# Patient Record
Sex: Male | Born: 1980 | Race: White | Hispanic: No | Marital: Single | Smoking: Current every day smoker
Health system: Southern US, Community
[De-identification: ages and names within clinical notes are randomized; demographics above are authoritative.]

## PROBLEM LIST (undated history)

## (undated) DIAGNOSIS — Z5189 Encounter for other specified aftercare: Secondary | ICD-10-CM

## (undated) DIAGNOSIS — B1920 Unspecified viral hepatitis C without hepatic coma: Secondary | ICD-10-CM

## (undated) DIAGNOSIS — Z22322 Carrier or suspected carrier of Methicillin resistant Staphylococcus aureus: Secondary | ICD-10-CM

## (undated) DIAGNOSIS — K259 Gastric ulcer, unspecified as acute or chronic, without hemorrhage or perforation: Secondary | ICD-10-CM

## (undated) HISTORY — PX: BELOW KNEE LEG AMPUTATION: SUR23

## (undated) HISTORY — PX: CHOLECYSTECTOMY: SHX55

---

## 2010-01-17 DIAGNOSIS — K259 Gastric ulcer, unspecified as acute or chronic, without hemorrhage or perforation: Secondary | ICD-10-CM

## 2010-01-17 HISTORY — DX: Gastric ulcer, unspecified as acute or chronic, without hemorrhage or perforation: K25.9

## 2014-09-05 ENCOUNTER — Inpatient Hospital Stay (HOSPITAL_COMMUNITY)
Admission: EM | Admit: 2014-09-05 | Discharge: 2014-09-13 | DRG: 442 | Disposition: A | Discharge status: Home / Self Care | Payer: BLUE CROSS/BLUE SHIELD | Attending: Internal Medicine | Admitting: Internal Medicine

## 2014-09-05 ENCOUNTER — Encounter (HOSPITAL_COMMUNITY): Payer: Self-pay

## 2014-09-05 DIAGNOSIS — D125 Benign neoplasm of sigmoid colon: Secondary | ICD-10-CM | POA: Diagnosis present

## 2014-09-05 DIAGNOSIS — D62 Acute posthemorrhagic anemia: Secondary | ICD-10-CM | POA: Diagnosis present

## 2014-09-05 DIAGNOSIS — T40605A Adverse effect of unspecified narcotics, initial encounter: Secondary | ICD-10-CM | POA: Diagnosis present

## 2014-09-05 DIAGNOSIS — F1123 Opioid dependence with withdrawal: Secondary | ICD-10-CM | POA: Diagnosis present

## 2014-09-05 DIAGNOSIS — B1710 Acute hepatitis C without hepatic coma: Secondary | ICD-10-CM | POA: Diagnosis not present

## 2014-09-05 DIAGNOSIS — K921 Melena: Secondary | ICD-10-CM | POA: Diagnosis present

## 2014-09-05 DIAGNOSIS — F17210 Nicotine dependence, cigarettes, uncomplicated: Secondary | ICD-10-CM | POA: Diagnosis present

## 2014-09-05 DIAGNOSIS — B179 Acute viral hepatitis, unspecified: Secondary | ICD-10-CM | POA: Diagnosis present

## 2014-09-05 DIAGNOSIS — K6289 Other specified diseases of anus and rectum: Secondary | ICD-10-CM | POA: Diagnosis present

## 2014-09-05 DIAGNOSIS — R1110 Vomiting, unspecified: Secondary | ICD-10-CM | POA: Diagnosis present

## 2014-09-05 DIAGNOSIS — R1011 Right upper quadrant pain: Secondary | ICD-10-CM | POA: Diagnosis present

## 2014-09-05 DIAGNOSIS — K920 Hematemesis: Secondary | ICD-10-CM | POA: Diagnosis present

## 2014-09-05 DIAGNOSIS — K7200 Acute and subacute hepatic failure without coma: Secondary | ICD-10-CM | POA: Diagnosis not present

## 2014-09-05 DIAGNOSIS — K922 Gastrointestinal hemorrhage, unspecified: Secondary | ICD-10-CM | POA: Diagnosis present

## 2014-09-05 HISTORY — DX: Unspecified viral hepatitis C without hepatic coma: B19.20

## 2014-09-05 HISTORY — DX: Gastric ulcer, unspecified as acute or chronic, without hemorrhage or perforation: K25.9

## 2014-09-05 LAB — CBC WITH DIFFERENTIAL/PLATELET
BASOS PCT: 0 % (ref 0–1)
Basophils Absolute: 0 10*3/uL (ref 0.0–0.1)
EOS ABS: 0.1 10*3/uL (ref 0.0–0.7)
Eosinophils Relative: 1 % (ref 0–5)
HCT: 41.5 % (ref 39.0–52.0)
HEMOGLOBIN: 14.3 g/dL (ref 13.0–17.0)
LYMPHS ABS: 1.2 10*3/uL (ref 0.7–4.0)
Lymphocytes Relative: 21 % (ref 12–46)
MCH: 30 pg (ref 26.0–34.0)
MCHC: 34.5 g/dL (ref 30.0–36.0)
MCV: 87 fL (ref 78.0–100.0)
Monocytes Absolute: 0.3 10*3/uL (ref 0.1–1.0)
Monocytes Relative: 5 % (ref 3–12)
NEUTROS PCT: 73 % (ref 43–77)
Neutro Abs: 4.2 10*3/uL (ref 1.7–7.7)
Platelets: 181 10*3/uL (ref 150–400)
RBC: 4.77 MIL/uL (ref 4.22–5.81)
RDW: 15 % (ref 11.5–15.5)
WBC: 5.8 10*3/uL (ref 4.0–10.5)

## 2014-09-05 LAB — COMPREHENSIVE METABOLIC PANEL
ALBUMIN: 4.2 g/dL (ref 3.5–5.0)
ALK PHOS: 100 U/L (ref 38–126)
ALT: 271 U/L — AB (ref 17–63)
AST: 122 U/L — AB (ref 15–41)
Anion gap: 9 (ref 5–15)
BUN: 8 mg/dL (ref 6–20)
CALCIUM: 9 mg/dL (ref 8.9–10.3)
CO2: 29 mmol/L (ref 22–32)
CREATININE: 0.86 mg/dL (ref 0.61–1.24)
Chloride: 106 mmol/L (ref 101–111)
GFR calc Af Amer: >60 mL/min (ref >60)
GFR calc non Af Amer: >60 mL/min (ref >60)
GLUCOSE: 126 mg/dL — AB (ref 65–99)
Potassium: 3.5 mmol/L (ref 3.5–5.1)
SODIUM: 144 mmol/L (ref 135–145)
Total Bilirubin: 0.7 mg/dL (ref 0.3–1.2)
Total Protein: 6.8 g/dL (ref 6.5–8.1)

## 2014-09-05 LAB — LIPASE, BLOOD: Lipase: 25 U/L (ref 22–51)

## 2014-09-05 LAB — URINALYSIS, ROUTINE W REFLEX MICROSCOPIC
BILIRUBIN URINE: NEGATIVE
Glucose, UA: NEGATIVE mg/dL
HGB URINE DIPSTICK: NEGATIVE
Ketones, ur: NEGATIVE mg/dL
Leukocytes, UA: NEGATIVE
Nitrite: NEGATIVE
PH: 7 (ref 5.0–8.0)
Protein, ur: NEGATIVE mg/dL
SPECIFIC GRAVITY, URINE: 1.019 (ref 1.005–1.030)
Urobilinogen, UA: 0.2 mg/dL (ref 0.0–1.0)

## 2014-09-05 LAB — POC OCCULT BLOOD, ED: Fecal Occult Bld: POSITIVE — AB

## 2014-09-05 MED ORDER — ONDANSETRON HCL 4 MG/2ML IJ SOLN
4.0000 mg | Freq: Once | INTRAMUSCULAR | Status: AC
  Administered 2014-09-05: 4 mg via INTRAVENOUS
  Filled 2014-09-05: qty 2

## 2014-09-05 MED ORDER — IOHEXOL 300 MG/ML  SOLN: 25.0000 mL | Freq: Once | INTRAMUSCULAR | Status: DC | PRN

## 2014-09-05 MED ORDER — METOCLOPRAMIDE HCL 5 MG/ML IJ SOLN
10.0000 mg | Freq: Once | INTRAMUSCULAR | Status: AC
  Administered 2014-09-05: 10 mg via INTRAVENOUS
  Filled 2014-09-05: qty 2

## 2014-09-05 MED ORDER — IBUPROFEN 800 MG PO TABS: 800.0000 mg | ORAL_TABLET | Freq: Once | ORAL | Status: DC

## 2014-09-05 MED ORDER — HYDROMORPHONE HCL 1 MG/ML IJ SOLN
1.0000 mg | INTRAMUSCULAR | Status: DC | PRN
  Administered 2014-09-05 – 2014-09-06 (×2): 1 mg via INTRAVENOUS
  Filled 2014-09-05 (×2): qty 1

## 2014-09-05 MED ORDER — SODIUM CHLORIDE 0.9 % IV SOLN
1000.0000 mL | Freq: Once | INTRAVENOUS | Status: AC
  Administered 2014-09-05: 1000 mL via INTRAVENOUS

## 2014-09-05 NOTE — ED Notes (Signed)
Per EMS pt started having n/v at 1300 with noted blood in stool x 2 days; Pt c/o of right upper/ middle abdominal pain  At 10/10 on arrival; Pt reports he had a bright red bloody stool this after noon; Pt has hx of Hep C; pt received 50mg  in total of phenergan prior to arrival. Pt is from [facility]; Pt has hx of ulcer in stomach in 2012 which he had surgery for; pt a&o x 4 on arrival.

## 2014-09-05 NOTE — ED Provider Notes (Signed)
CSN: [id number]     Arrival date & time 09/05/14  2214 History   First MD Initiated Contact with Patient 09/05/14 2258     Chief Complaint  Patient presents with  . Nausea  . Emesis  . Abdominal Pain  . Blood In Stools     (Consider location/radiation/quality/duration/timing/severity/associated sxs/prior Treatment) HPI  Dymond Spreen is a 34 y.o. male with past medical history of hepatitis C, gastric ulcer status post repair presenting today with abdominal pain. He states he's had blood in his stool for the past 2 days. He has sharp midepigastric abdominal pain without radiation. Today he had multiple episodes of nausea and vomiting. He states he has bright and dark stool over the last 2 days. Patient is a transfer from [facility]. He was given Phenergan prior to arrival. He continues to complain of abdominal pain. He states he's also had fevers and chills. Patient has no further complaints.  10 Systems reviewed and are negative for acute change except as noted in the HPI.    Past Medical History  Diagnosis Date  . Hepatitis C   . Stomach ulcer 2012   History reviewed. No pertinent past surgical history. No family history on file. Social History  Substance Use Topics  . Smoking status: Current Every Day Smoker -- 0.50 packs/day    Types: Cigarettes  . Smokeless tobacco: None  . Alcohol Use: No    Review of Systems    Allergies  Review of patient's allergies indicates not on file.  Home Medications   Prior to Admission medications   Not on File   BP 114/57 mmHg  Pulse 72  Temp(Src) 100.6 F (38.1 C) (Rectal)  Resp 10  Ht 6' (1.829 m)  Wt 180 lb (81.647 kg)  BMI 24.41 kg/m2  SpO2 97% Physical Exam  Constitutional: He is oriented to person, place, and time. Vital signs are normal. He appears well-developed and well-nourished.  Non-toxic appearance. He does not appear ill. No distress.  HENT:  Head: Normocephalic and atraumatic.  Nose: Nose normal.   Mouth/Throat: Oropharynx is clear and moist. No oropharyngeal exudate.  Eyes: Conjunctivae and EOM are normal. Pupils are equal, round, and reactive to light. No scleral icterus.  Neck: Normal range of motion. Neck supple. No tracheal deviation, no edema, no erythema and normal range of motion present. No thyroid mass and no thyromegaly present.  Cardiovascular: Normal rate, regular rhythm, S1 normal, S2 normal, normal heart sounds, intact distal pulses and normal pulses.  Exam reveals no gallop and no friction rub.   No murmur heard. Pulses:      Radial pulses are 2+ on the right side, and 2+ on the left side.       Dorsalis pedis pulses are 2+ on the right side, and 2+ on the left side.  Pulmonary/Chest: Effort normal and breath sounds normal. No respiratory distress. He has no wheezes. He has no rhonchi. He has no rales.  Abdominal: Soft. Normal appearance and bowel sounds are normal. He exhibits no distension, no ascites and no mass. There is no hepatosplenomegaly. There is tenderness. There is no rebound, no guarding and no CVA tenderness.  Genitourinary: Guaiac positive stool.  Musculoskeletal: Normal range of motion. He exhibits no edema or tenderness.  Status post right AKA  Lymphadenopathy:    He has no cervical adenopathy.  Neurological: He is alert and oriented to person, place, and time. He has normal strength. No cranial nerve deficit or sensory deficit.  Skin: Skin  is warm, dry and intact. No petechiae and no rash noted. He is not diaphoretic. No erythema. No pallor.  Tactile fever.  Psychiatric: He has a normal mood and affect. His behavior is normal. Judgment normal.  Nursing note and vitals reviewed.   ED Course  Procedures (including critical care time) Labs Review Labs Reviewed  COMPREHENSIVE METABOLIC PANEL - Abnormal; Notable for the following:    Glucose, Bld 126 (*)    AST 122 (*)    ALT 271 (*)    All other components within normal limits  URINALYSIS, ROUTINE  W REFLEX MICROSCOPIC (NOT AT [facility]) - Abnormal; Notable for the following:    Color, Urine AMBER (*)    All other components within normal limits  POC OCCULT BLOOD, ED - Abnormal; Notable for the following:    Fecal Occult Bld POSITIVE (*)    All other components within normal limits  CBC WITH DIFFERENTIAL/PLATELET  LIPASE, BLOOD  OCCULT BLOOD X 1 CARD TO LAB, STOOL    Imaging Review Ct Abdomen Pelvis W Contrast  09/06/2014   CLINICAL DATA:  Mid epigastric abdominal pain with fever, nausea, and vomiting.  EXAM: CT ABDOMEN AND PELVIS WITH CONTRAST  TECHNIQUE: Multidetector CT imaging of the abdomen and pelvis was performed using the standard protocol following bolus administration of intravenous contrast.  CONTRAST:  120mL OMNIPAQUE IOHEXOL 300 MG/ML  SOLN  COMPARISON:  None.  FINDINGS: BODY WALL: Prior upper midline laparotomy.  LOWER CHEST: Subpleural scarring in the right lower lobe, mild.  ABDOMEN/PELVIS:  Liver: No focal abnormality.  Biliary: Cholecystectomy.  Pancreas: Unremarkable.  Spleen: Unremarkable.  Adrenals: Unremarkable.  Kidneys and ureters: Punctate nonobstructive stones in the bilateral kidneys. No ureteral calculus or hydronephrosis.  Bladder: Unremarkable.  Reproductive: No pathologic findings.  Bowel: No definite bowel wall thickening when accounting for under distention. No bowel obstruction. No appendicitis.  Retroperitoneum: Enlarged lymph nodes in the deep liver drainage, usually incidental in isolation.  Peritoneum: No ascites or pneumoperitoneum.  Vascular: Diminutive right lower extremity arterial and venous system in the setting of marked atrophy.  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  L1-L2 interbody ankylosis.  IMPRESSION: 1. No acute finding, including appendicitis. 2. 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 3. Cholecystectomy. 4. Tiny bilateral renal calculi.   Electronically Signed   By: Monte Fantasia M.D.   On: 09/06/2014 01:06   I have personally reviewed and evaluated these images and lab results as part of my medical decision-making.   EKG Interpretation None      MDM   Final diagnoses:  None   Patient presents to the ED for abdominal pain, blood in stool, and fever.  He is tender on exam as well.  Will obtain CT scan for evaluation.  He is also complaining of significant pain and takes a large dose of oxycodone at home, 30mg .  Will given 1mg  dilaudid and reassess.  Hgb normal at 14.3.  LFTs are elevated  CT scan is unremarkable. Patient continues to complain of vomiting and pain. He has been ordered Reglan as well as Zofran. If his symptoms can be controlled, patient safe for discharge with outpatient follow-up.  Patient's laboratory studies from August 8 show LFTs are normal. It is likely medications he has been given at his rehabilitation facility have caused an acute hepatitis. I do not know what the cause  of his fever is. Patient will be admitted to the hospitalist for further care.    Everlene Balls, MD 09/06/14 [phone]

## 2014-09-05 NOTE — ED Notes (Signed)
Pt has right AKA; Prosthetic at bedside

## 2014-09-06 ENCOUNTER — Emergency Department (HOSPITAL_COMMUNITY): Payer: BLUE CROSS/BLUE SHIELD

## 2014-09-06 ENCOUNTER — Encounter (HOSPITAL_COMMUNITY): Payer: Self-pay | Admitting: Radiology

## 2014-09-06 DIAGNOSIS — F17210 Nicotine dependence, cigarettes, uncomplicated: Secondary | ICD-10-CM | POA: Diagnosis present

## 2014-09-06 DIAGNOSIS — B1710 Acute hepatitis C without hepatic coma: Secondary | ICD-10-CM | POA: Diagnosis present

## 2014-09-06 DIAGNOSIS — D125 Benign neoplasm of sigmoid colon: Secondary | ICD-10-CM | POA: Diagnosis present

## 2014-09-06 DIAGNOSIS — S78111A Complete traumatic amputation at level between right hip and knee, initial encounter: Secondary | ICD-10-CM | POA: Insufficient documentation

## 2014-09-06 DIAGNOSIS — B1920 Unspecified viral hepatitis C without hepatic coma: Secondary | ICD-10-CM | POA: Insufficient documentation

## 2014-09-06 DIAGNOSIS — F1123 Opioid dependence with withdrawal: Secondary | ICD-10-CM | POA: Diagnosis present

## 2014-09-06 DIAGNOSIS — K7200 Acute and subacute hepatic failure without coma: Secondary | ICD-10-CM | POA: Diagnosis present

## 2014-09-06 DIAGNOSIS — B179 Acute viral hepatitis, unspecified: Secondary | ICD-10-CM | POA: Diagnosis present

## 2014-09-06 DIAGNOSIS — R1011 Right upper quadrant pain: Secondary | ICD-10-CM | POA: Diagnosis not present

## 2014-09-06 DIAGNOSIS — K922 Gastrointestinal hemorrhage, unspecified: Secondary | ICD-10-CM | POA: Diagnosis present

## 2014-09-06 DIAGNOSIS — R1110 Vomiting, unspecified: Secondary | ICD-10-CM | POA: Diagnosis present

## 2014-09-06 DIAGNOSIS — E876 Hypokalemia: Secondary | ICD-10-CM | POA: Diagnosis not present

## 2014-09-06 DIAGNOSIS — K625 Hemorrhage of anus and rectum: Secondary | ICD-10-CM | POA: Diagnosis not present

## 2014-09-06 DIAGNOSIS — K920 Hematemesis: Secondary | ICD-10-CM | POA: Diagnosis present

## 2014-09-06 DIAGNOSIS — D62 Acute posthemorrhagic anemia: Secondary | ICD-10-CM | POA: Diagnosis present

## 2014-09-06 DIAGNOSIS — K219 Gastro-esophageal reflux disease without esophagitis: Secondary | ICD-10-CM | POA: Insufficient documentation

## 2014-09-06 DIAGNOSIS — K6289 Other specified diseases of anus and rectum: Secondary | ICD-10-CM | POA: Diagnosis present

## 2014-09-06 DIAGNOSIS — T40605A Adverse effect of unspecified narcotics, initial encounter: Secondary | ICD-10-CM | POA: Diagnosis present

## 2014-09-06 DIAGNOSIS — K921 Melena: Secondary | ICD-10-CM | POA: Diagnosis present

## 2014-09-06 LAB — COMPREHENSIVE METABOLIC PANEL
ALBUMIN: 3.8 g/dL (ref 3.5–5.0)
ALT: 230 U/L — ABNORMAL HIGH (ref 17–63)
ANION GAP: 10 (ref 5–15)
AST: 96 U/L — AB (ref 15–41)
Alkaline Phosphatase: 85 U/L (ref 38–126)
BILIRUBIN TOTAL: 0.8 mg/dL (ref 0.3–1.2)
BUN: 10 mg/dL (ref 6–20)
CHLORIDE: 109 mmol/L (ref 101–111)
CO2: 24 mmol/L (ref 22–32)
Calcium: 8.3 mg/dL — ABNORMAL LOW (ref 8.9–10.3)
Creatinine, Ser: 0.66 mg/dL (ref 0.61–1.24)
GFR calc Af Amer: >60 mL/min (ref >60)
GFR calc non Af Amer: >60 mL/min (ref >60)
GLUCOSE: 113 mg/dL — AB (ref 65–99)
POTASSIUM: 4.2 mmol/L (ref 3.5–5.1)
SODIUM: 143 mmol/L (ref 135–145)
Total Protein: 6.4 g/dL — ABNORMAL LOW (ref 6.5–8.1)

## 2014-09-06 LAB — CBC
HEMATOCRIT: 40.3 % (ref 39.0–52.0)
HEMOGLOBIN: 13.2 g/dL (ref 13.0–17.0)
MCH: 29.3 pg (ref 26.0–34.0)
MCHC: 32.8 g/dL (ref 30.0–36.0)
MCV: 89.4 fL (ref 78.0–100.0)
Platelets: 154 10*3/uL (ref 150–400)
RBC: 4.51 MIL/uL (ref 4.22–5.81)
RDW: 15.4 % (ref 11.5–15.5)
WBC: 5.9 10*3/uL (ref 4.0–10.5)

## 2014-09-06 MED ORDER — FENTANYL 12 MCG/HR TD PT72
12.5000 ug | MEDICATED_PATCH | TRANSDERMAL | Status: DC
  Administered 2014-09-06: 12.5 ug via TRANSDERMAL
  Filled 2014-09-06: qty 1

## 2014-09-06 MED ORDER — MORPHINE SULFATE (PF) 2 MG/ML IV SOLN
1.0000 mg | INTRAVENOUS | Status: DC | PRN
  Administered 2014-09-06 (×2): 1 mg via INTRAVENOUS
  Filled 2014-09-06 (×2): qty 1

## 2014-09-06 MED ORDER — GABAPENTIN 600 MG PO TABS: 600.0000 mg | ORAL_TABLET | Freq: Three times a day (TID) | ORAL | Status: DC

## 2014-09-06 MED ORDER — METOCLOPRAMIDE HCL 5 MG/ML IJ SOLN
5.0000 mg | Freq: Once | INTRAMUSCULAR | Status: AC
  Administered 2014-09-06: 5 mg via INTRAVENOUS
  Filled 2014-09-06: qty 2

## 2014-09-06 MED ORDER — ONDANSETRON HCL 4 MG/2ML IJ SOLN
4.0000 mg | Freq: Once | INTRAMUSCULAR | Status: AC
  Administered 2014-09-06: 4 mg via INTRAVENOUS
  Filled 2014-09-06: qty 2

## 2014-09-06 MED ORDER — SENNOSIDES-DOCUSATE SODIUM 8.6-50 MG PO TABS: 1.0000 | ORAL_TABLET | Freq: Every evening | ORAL | Status: DC | PRN

## 2014-09-06 MED ORDER — ONDANSETRON HCL 4 MG PO TABS
4.0000 mg | ORAL_TABLET | Freq: Four times a day (QID) | ORAL | Status: DC | PRN
  Filled 2014-09-06: qty 1

## 2014-09-06 MED ORDER — PANTOPRAZOLE SODIUM 40 MG IV SOLR
40.0000 mg | Freq: Two times a day (BID) | INTRAVENOUS | Status: DC
  Administered 2014-09-06 – 2014-09-13 (×16): 40 mg via INTRAVENOUS
  Filled 2014-09-06 (×16): qty 40

## 2014-09-06 MED ORDER — CLONIDINE HCL 0.2 MG PO TABS
0.2000 mg | ORAL_TABLET | Freq: Three times a day (TID) | ORAL | Status: DC | PRN
  Filled 2014-09-06: qty 1

## 2014-09-06 MED ORDER — HYDROMORPHONE HCL 1 MG/ML IJ SOLN
1.0000 mg | Freq: Once | INTRAMUSCULAR | Status: AC
  Administered 2014-09-06: 1 mg via INTRAVENOUS
  Filled 2014-09-06: qty 1

## 2014-09-06 MED ORDER — SODIUM CHLORIDE 0.9 % IV SOLN
INTRAVENOUS | Status: DC
  Administered 2014-09-06 – 2014-09-07 (×3): via INTRAVENOUS
  Administered 2014-09-08: 100 mL/h via INTRAVENOUS
  Administered 2014-09-08 – 2014-09-11 (×6): via INTRAVENOUS

## 2014-09-06 MED ORDER — DICYCLOMINE HCL 10 MG PO CAPS
10.0000 mg | ORAL_CAPSULE | Freq: Four times a day (QID) | ORAL | Status: DC | PRN
  Administered 2014-09-07 – 2014-09-09 (×4): 10 mg via ORAL
  Filled 2014-09-06 (×5): qty 1

## 2014-09-06 MED ORDER — ONDANSETRON HCL 4 MG/2ML IJ SOLN
4.0000 mg | Freq: Four times a day (QID) | INTRAMUSCULAR | Status: DC | PRN
  Administered 2014-09-06 – 2014-09-12 (×21): 4 mg via INTRAVENOUS
  Filled 2014-09-06 (×22): qty 2

## 2014-09-06 MED ORDER — IOHEXOL 300 MG/ML  SOLN
100.0000 mL | Freq: Once | INTRAMUSCULAR | Status: AC | PRN
  Administered 2014-09-06: 100 mL via INTRAVENOUS

## 2014-09-06 MED ORDER — METHOCARBAMOL 500 MG PO TABS
1000.0000 mg | ORAL_TABLET | Freq: Three times a day (TID) | ORAL | Status: DC | PRN
  Administered 2014-09-06 – 2014-09-07 (×3): 1000 mg via ORAL
  Filled 2014-09-06 (×3): qty 2

## 2014-09-06 MED ORDER — ALUM & MAG HYDROXIDE-SIMETH 200-200-20 MG/5ML PO SUSP: 30.0000 mL | Freq: Four times a day (QID) | ORAL | Status: DC | PRN

## 2014-09-06 MED ORDER — PROMETHAZINE HCL 25 MG/ML IJ SOLN
25.0000 mg | Freq: Once | INTRAMUSCULAR | Status: AC
  Administered 2014-09-06: 25 mg via INTRAVENOUS
  Filled 2014-09-06: qty 1

## 2014-09-06 NOTE — ED Notes (Signed)
Patient transported to CT 

## 2014-09-06 NOTE — H&P (Signed)
History and Physical  Cameron Morton YQI:[id number] DOB: Sep 13, 1980 DOA: 09/05/2014  Referring physician: Dr Claudine Mouton, ED physician PCP: No primary care provider on file.   Chief Complaint: Abdominal pain, vomiting  HPI: Cameron Morton is a 34 y.o. male  With a history of hep C, stomach ulcers, opiate addiction. He is in recovery at [facility] was admitted on 08/25/2014. He has been there for opiate recovery. He was given Subutex 6 mg 2 days, then 4 mg 2 days, then 2 mg 2 days, then stopped. His last dose of Subutex was on 8/14. He reports no complications or some withdrawal symptoms within 2-3 days of taking the medication. He presented to the emergency department due to extreme generalized cramping abdominal pain with profuse vomiting that started on the morning of the 19th. When his symptoms were not resolving, the patient was brought to the emergency department for evaluation. The emergency department he received a milligram of Dilaudid, which improved his abdominal pain. The patient received several doses of Reglan, Phenergan, Zofran, which did not resolve his vomiting. His report vomiting did lighten somewhat with the Dilaudid although is still had a few episodes of vomiting. Vomiting was just stomach contents. Most of his abdominal pain is now centered in the right upper quadrant.  Additionally, he complains of 2 small bowel movements that appeared like several small blood clots, which were dark red in nature. He's never had this before. He denies rectal pain. He does have occasional hemorrhoids.  Patient denies any recent drug use   Review of Systems:   Pt complains of sweating, occasional palpitations.  Pt denies any fevers, chills, shortness of breath, chest pain, dyspnea on exertion, headache, blurred vision, vision changes, rash, skin ulcerations, skin wounds, diarrhea, constipation.  Review of systems are otherwise negative  Past Medical History  Diagnosis Date  . Hepatitis C   .  Stomach ulcer 2012   History reviewed. No pertinent past surgical history. Social History:  reports that he has been smoking Cigarettes.  He has been smoking about 0.50 packs per day. He does not have any smokeless tobacco history on file. He reports that he does not drink alcohol or use illicit drugs. Patient lives at [facility] currently & is able to participate in activities of daily living  No Known Allergies   family history of diabetes in grandparents on both sides.  Prior to Admission medications   Medication Sig Start Date End Date Taking? Authorizing Provider  acetaminophen (TYLENOL) 500 MG tablet Take 1,000 mg by mouth every 6 (six) hours as needed for mild pain.   Yes Historical Provider, MD  alum & mag hydroxide-simeth (MAALOX/MYLANTA) 200-200-20 MG/5ML suspension Take 30 mLs by mouth every 6 (six) hours as needed for indigestion or heartburn.   Yes Historical Provider, MD  gabapentin (NEURONTIN) 600 MG tablet Take 600 mg by mouth 3 (three) times daily.   Yes Historical Provider, MD  methocarbamol (ROBAXIN) 500 MG tablet Take 1,000 mg by mouth 3 (three) times daily as needed for muscle spasms.   Yes Historical Provider, MD  Multiple Vitamin (MULTIVITAMIN WITH MINERALS) TABS tablet Take 1 tablet by mouth daily.   Yes Historical Provider, MD  promethazine (PHENERGAN) 25 MG/ML injection Inject 25 mg into the vein every 4 (four) hours as needed for nausea or vomiting.   Yes Historical Provider, MD  QUEtiapine (SEROQUEL) 50 MG tablet Take 50 mg by mouth at bedtime as needed (sleep).   Yes Historical Provider, MD    Physical  Exam: BP 124/70 mmHg  Pulse 68  Temp(Src) 100.6 F (38.1 C) (Rectal)  Resp 19  Ht 6' (1.829 m)  Wt 81.647 kg (180 lb)  BMI 24.41 kg/m2  SpO2 96%  General: Young Caucasian male. Awake and alert and oriented x3. No acute cardiopulmonary distress. He doesn't appear uncomfortable  Eyes: Pupils equal, round, reactive to light. Extraocular muscles are intact.  Sclerae anicteric and noninjected.  ENT:  Moist mucosal membranes. No mucosal lesions.   Neck: Neck supple without lymphadenopathy. No carotid bruits. No masses palpated.  Cardiovascular: Regular rate with normal S1-S2 sounds. No murmurs, rubs, gallops auscultated. No JVD.  Respiratory: Good respiratory effort with no wheezes, rales, rhonchi. Lungs clear to auscultation bilaterally.  Abdomen: Soft, tender in the right upper quadrant, nondistended. Active bowel sounds. No masses or hepatosplenomegaly  Skin: Dry, warm to touch. 2+ dorsalis pedis and radial pulses. His stump appears clean, dry, intact and without any rashes or ulcerations Musculoskeletal: No calf or leg pain. All major joints not erythematous nontender.  Psychiatric: Intact judgment and insight.  Neurologic: No focal neurological deficits. Cranial nerves II through XII are grossly intact.           Labs on Admission:  Basic Metabolic Panel:  Recent Labs Lab 09/05/14 2303  NA 144  K 3.5  CL 106  CO2 29  GLUCOSE 126*  BUN 8  CREATININE 0.86  CALCIUM 9.0   Liver Function Tests:  Recent Labs Lab 09/05/14 2303  AST 122*  ALT 271*  ALKPHOS 100  BILITOT 0.7  PROT 6.8  ALBUMIN 4.2    Recent Labs Lab 09/05/14 2303  LIPASE 25   No results for input(s): AMMONIA in the last 168 hours. CBC:  Recent Labs Lab 09/05/14 2303  WBC 5.8  NEUTROABS 4.2  HGB 14.3  HCT 41.5  MCV 87.0  PLT 181   Cardiac Enzymes: No results for input(s): CKTOTAL, CKMB, CKMBINDEX, TROPONINI in the last 168 hours.  BNP (last 3 results) No results for input(s): BNP in the last 8760 hours.  ProBNP (last 3 results) No results for input(s): PROBNP in the last 8760 hours.  CBG: No results for input(s): GLUCAP in the last 168 hours.  Radiological Exams on Admission: Ct Abdomen Pelvis W Contrast  09/06/2014   CLINICAL DATA:  Mid epigastric abdominal pain with fever, nausea, and vomiting.  EXAM: CT ABDOMEN AND PELVIS WITH CONTRAST   TECHNIQUE: Multidetector CT imaging of the abdomen and pelvis was performed using the standard protocol following bolus administration of intravenous contrast.  CONTRAST:  114mL OMNIPAQUE IOHEXOL 300 MG/ML  SOLN  COMPARISON:  None.  FINDINGS: BODY WALL: Prior upper midline laparotomy.  LOWER CHEST: Subpleural scarring in the right lower lobe, mild.  ABDOMEN/PELVIS:  Liver: No focal abnormality.  Biliary: Cholecystectomy.  Pancreas: Unremarkable.  Spleen: Unremarkable.  Adrenals: Unremarkable.  Kidneys and ureters: Punctate nonobstructive stones in the bilateral kidneys. No ureteral calculus or hydronephrosis.  Bladder: Unremarkable.  Reproductive: No pathologic findings.  Bowel: No definite bowel wall thickening when accounting for under distention. No bowel obstruction. No appendicitis.  Retroperitoneum: Enlarged lymph nodes in the deep liver drainage, usually incidental in isolation.  Peritoneum: No ascites or pneumoperitoneum.  Vascular: Diminutive right lower extremity arterial and venous system in the setting of marked atrophy.  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  L1-L2 interbody ankylosis.  IMPRESSION: 1.  No acute finding, including appendicitis. 2. 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 3. Cholecystectomy. 4. Tiny bilateral renal calculi.   Electronically Signed   By: Monte Fantasia M.D.   On: 09/06/2014 01:06     Assessment/Plan Present on Admission:  . Acute hepatitis . Abdominal pain, right upper quadrant . Vomiting . GI bleed  This patient was discussed with the ED physician, including pertinent vitals, physical exam findings, labs, and imaging.  We also discussed care given by the ED provider.  #1 abdominal pain, right upper quadrant #2 vomiting  Admit for observation  Uncertain as to the etiology of the patient's  vomiting. He does have a mild fever, however his white count is normal. There is no left shift or increased ANC.  His symptoms are fairly consistent with opiate withdrawal, however the timing of the withdrawal is not correct in comparison to the stated/documented day of his last Subutex.  I would expected withdrawal symptoms within 48-72 hours of his last dose of Subutex. Additionally, Subutex is not hepatically cleared, therefore it should not have taken longer to clear. Will treat with the following:  IV fluids  Antiemetics  We'll give additional dose of Dilaudid tonight  Bentyl  Clonidine #3 acute hepatitis  Again, uncertain as to why his liver enzymes are increased. None of the medications given to him at [facility] are hepatically cleared, although gabapentin can cause hepatitis.  We'll DC gabapentin  Repeat labs in the morning #4 GI bleeding  Fairly minimal and probably self limiting. Will observe for now. If persists or worsens, will consult GI   DVT prophylaxis: Ambulation  Consultants: None  Code Status: Full code  Family Communication: None   Disposition Plan: Admit   Truett Mainland, DO [facility] Pager [phone]

## 2014-09-06 NOTE — Progress Notes (Signed)
Patient complaining of abdominal discomfort. Will place on fentanyl instead of morphine.  CT of abdomen reported no acute findings.  Currently gabapentin felt to be recent of acute hepatitis.  Reassess CMP next a.m.  Patient in no acute distress alert and awake No cyanosis No increased work of breathing, equal chest rise Nondistended no guarding  Cameron Morton, [employer]

## 2014-09-06 NOTE — Progress Notes (Signed)
Patient arrived on unit via stretcher with nurse tech. Patient alert and oriented x4. Patient oriented to unit, staff and unit. Patient Iv clean, dry and intact. Skin assessment completed with Leandro Reasoner, RN, no skin issues present. Patient rates pain 10/10 and feeling nauseous. Pain medication and antiemetic administered. Safety Fall Prevention Plan was given, discussed and signed by patient. Orders have been reviewed and implemented. Call light has been placed within reach.RN will continue to monitor patient.   Nena Polio BSN, RN  Phone Number: [phone]

## 2014-09-07 LAB — COMPREHENSIVE METABOLIC PANEL
ALBUMIN: 3.5 g/dL (ref 3.5–5.0)
ALK PHOS: 76 U/L (ref 38–126)
ALT: 170 U/L — ABNORMAL HIGH (ref 17–63)
ANION GAP: 11 (ref 5–15)
AST: 68 U/L — ABNORMAL HIGH (ref 15–41)
BILIRUBIN TOTAL: 0.8 mg/dL (ref 0.3–1.2)
BUN: 12 mg/dL (ref 6–20)
CALCIUM: 8.1 mg/dL — AB (ref 8.9–10.3)
CO2: 24 mmol/L (ref 22–32)
Chloride: 105 mmol/L (ref 101–111)
Creatinine, Ser: 0.68 mg/dL (ref 0.61–1.24)
GFR calc Af Amer: >60 mL/min (ref >60)
GLUCOSE: 104 mg/dL — AB (ref 65–99)
POTASSIUM: 3.6 mmol/L (ref 3.5–5.1)
Sodium: 140 mmol/L (ref 135–145)
TOTAL PROTEIN: 5.9 g/dL — AB (ref 6.5–8.1)

## 2014-09-07 MED ORDER — OXYCODONE HCL 5 MG PO TABS
5.0000 mg | ORAL_TABLET | Freq: Four times a day (QID) | ORAL | Status: DC | PRN
  Administered 2014-09-07 – 2014-09-13 (×21): 5 mg via ORAL
  Filled 2014-09-07 (×21): qty 1

## 2014-09-07 MED ORDER — FENTANYL CITRATE (PF) 100 MCG/2ML IJ SOLN
25.0000 ug | INTRAMUSCULAR | Status: DC | PRN
  Administered 2014-09-07 – 2014-09-12 (×35): 25 ug via INTRAVENOUS
  Filled 2014-09-07 (×36): qty 2

## 2014-09-07 NOTE — Progress Notes (Signed)
Utilization Review Completed.Cameron Morton T8/21/2016  

## 2014-09-07 NOTE — Progress Notes (Signed)
RN called to patient's room.  C/O severe abdominal pain 8/10 with vomiting of green vomitus.  He also felt like he had to pass gas and had bright red loose stool.  This was a small amount.  [facility] called and made aware.  VS stable.  IV phenergan 25 mg was given.  Will continue to monitor patient.  Earleen Reaper RN-BC, [employer]

## 2014-09-07 NOTE — Progress Notes (Signed)
[facility] PROGRESS NOTE  Cameron Morton GDJ:[id number] DOB: [birth year] DOA: 09/05/2014 PCP: No primary care provider on file.  Assessment/Plan: Active Problems:   Acute hepatitis - Presumed to be secondary to Gabapentin use - advance diet as tolerated - supportive therapy - continue protonix - CT scan reports no acute findings, including appendicitis  Abdominal pain, right upper quadrant - Most likely 2ary to above   Code Status: full Family Communication: None at bedside Disposition Plan: Pending improvement in condition.   Consultants:  None  Procedures:  none  Antibiotics:  None  HPI/Subjective: Pt has no new complaints. No acute issues overnight.  Objective: Filed Vitals:   09/07/14 0610  BP: 132/76  Pulse: 60  Temp: 98.5 F (36.9 C)  Resp: 18    Intake/Output Summary (Last 24 hours) at 09/07/14 1753 Last data filed at 09/07/14 0600  Gross per 24 hour  Intake   1800 ml  Output      0 ml  Net   1800 ml   Filed Weights   09/05/14 2222 09/06/14 0319  Weight: 81.647 kg (180 lb) 77.248 kg (170 lb 4.8 oz)    Exam:   General:  Pt in nad, alert and awake  Cardiovascular: rrr, no mrg  Respiratory: cta bl, no wheezes  Abdomen: ruq discomfort with deep palpation, soft, ND  Musculoskeletal: no cyanosis or clubbing   Data Reviewed: Basic Metabolic Panel:  Recent Labs Lab 09/05/14 2303 09/06/14 0438 09/07/14 0538  NA 144 143 140  K 3.5 4.2 3.6  CL 106 109 105  CO2 29 24 24   GLUCOSE 126* 113* 104*  BUN 8 10 12   CREATININE 0.86 0.66 0.68  CALCIUM 9.0 8.3* 8.1*   Liver Function Tests:  Recent Labs Lab 09/05/14 2303 09/06/14 0438 09/07/14 0538  AST 122* 96* 68*  ALT 271* 230* 170*  ALKPHOS 100 85 76  BILITOT 0.7 0.8 0.8  PROT 6.8 6.4* 5.9*  ALBUMIN 4.2 3.8 3.5    Recent Labs Lab 09/05/14 2303  LIPASE 25   No results for input(s): AMMONIA in the last 168 hours. CBC:  Recent Labs Lab 09/05/14 2303 09/06/14 0438   WBC 5.8 5.9  NEUTROABS 4.2  --   HGB 14.3 13.2  HCT 41.5 40.3  MCV 87.0 89.4  PLT 181 154   Cardiac Enzymes: No results for input(s): CKTOTAL, CKMB, CKMBINDEX, TROPONINI in the last 168 hours. BNP (last 3 results) No results for input(s): BNP in the last 8760 hours.  ProBNP (last 3 results) No results for input(s): PROBNP in the last 8760 hours.  CBG: No results for input(s): GLUCAP in the last 168 hours.  No results found for this or any previous visit (from the past 240 hour(s)).   Studies: Ct Abdomen Pelvis W Contrast  09/06/2014   CLINICAL DATA:  Mid epigastric abdominal pain with fever, nausea, and vomiting.  EXAM: CT ABDOMEN AND PELVIS WITH CONTRAST  TECHNIQUE: Multidetector CT imaging of the abdomen and pelvis was performed using the standard protocol following bolus administration of intravenous contrast.  CONTRAST:  136mL OMNIPAQUE IOHEXOL 300 MG/ML  SOLN  COMPARISON:  None.  FINDINGS: BODY WALL: Prior upper midline laparotomy.  LOWER CHEST: Subpleural scarring in the right lower lobe, mild.  ABDOMEN/PELVIS:  Liver: No focal abnormality.  Biliary: Cholecystectomy.  Pancreas: Unremarkable.  Spleen: Unremarkable.  Adrenals: Unremarkable.  Kidneys and ureters: Punctate nonobstructive stones in the bilateral kidneys. No ureteral calculus or hydronephrosis.  Bladder: Unremarkable.  Reproductive: No pathologic findings.  Bowel: No definite bowel wall thickening when accounting for under distention. No bowel obstruction. No appendicitis.  Retroperitoneum: Enlarged lymph nodes in the deep liver drainage, usually incidental in isolation.  Peritoneum: No ascites or pneumoperitoneum.  Vascular: Diminutive right lower extremity arterial and venous system in the setting of marked atrophy.  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  L1-L2 interbody ankylosis.   IMPRESSION: 1. No acute finding, including appendicitis. 2. 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 3. Cholecystectomy. 4. Tiny bilateral renal calculi.   Electronically Signed   By: Monte Fantasia M.D.   On: 09/06/2014 01:06    Scheduled Meds: . pantoprazole (PROTONIX) IV  40 mg Intravenous Q12H   Continuous Infusions: . sodium chloride 100 mL/hr at 09/07/14 1224    Time spent: > 35 minutes    Velvet Bathe  [facility] Pager [phone] If 7PM-7AM, please contact night-coverage at [email or web address], password [facility] 09/07/2014, 5:53 PM  LOS: 1 day

## 2014-09-08 LAB — COMPREHENSIVE METABOLIC PANEL
ALT: 132 U/L — AB (ref 17–63)
AST: 50 U/L — AB (ref 15–41)
Albumin: 3.4 g/dL — ABNORMAL LOW (ref 3.5–5.0)
Alkaline Phosphatase: 73 U/L (ref 38–126)
Anion gap: 8 (ref 5–15)
BUN: 12 mg/dL (ref 6–20)
CHLORIDE: 108 mmol/L (ref 101–111)
CO2: 24 mmol/L (ref 22–32)
Calcium: 8 mg/dL — ABNORMAL LOW (ref 8.9–10.3)
Creatinine, Ser: 0.66 mg/dL (ref 0.61–1.24)
Glucose, Bld: 97 mg/dL (ref 65–99)
POTASSIUM: 3.6 mmol/L (ref 3.5–5.1)
SODIUM: 140 mmol/L (ref 135–145)
Total Bilirubin: 0.8 mg/dL (ref 0.3–1.2)
Total Protein: 5.8 g/dL — ABNORMAL LOW (ref 6.5–8.1)

## 2014-09-08 LAB — CBC
HEMATOCRIT: 36 % — AB (ref 39.0–52.0)
Hemoglobin: 11.8 g/dL — ABNORMAL LOW (ref 13.0–17.0)
MCH: 28.7 pg (ref 26.0–34.0)
MCHC: 32.8 g/dL (ref 30.0–36.0)
MCV: 87.6 fL (ref 78.0–100.0)
Platelets: 140 10*3/uL — ABNORMAL LOW (ref 150–400)
RBC: 4.11 MIL/uL — AB (ref 4.22–5.81)
RDW: 14.8 % (ref 11.5–15.5)
WBC: 4.2 10*3/uL (ref 4.0–10.5)

## 2014-09-08 MED ORDER — METOCLOPRAMIDE HCL 5 MG/ML IJ SOLN
5.0000 mg | Freq: Once | INTRAMUSCULAR | Status: DC | PRN
  Administered 2014-09-08 – 2014-09-09 (×2): 5 mg via INTRAVENOUS
  Filled 2014-09-08 (×2): qty 2

## 2014-09-08 NOTE — Progress Notes (Signed)
[facility] PROGRESS NOTE  Cameron Morton SWF:[id number] DOB: 1980-10-10 DOA: 09/05/2014 PCP: No primary care provider on file.  Assessment/Plan: Active Problems:   Acute hepatitis - Presumed to be secondary to Gabapentin use. Has been improving off of this medication - advance diet as tolerated - supportive therapy - continue protonix - CT scan reports no acute findings, including appendicitis - Reassess liver enzymes next am.  Abdominal pain, right upper quadrant - Most likely 2ary to above   Code Status: full Family Communication: None at bedside Disposition Plan: Pending improvement in condition.   Consultants:  None  Procedures:  none  Antibiotics:  None  HPI/Subjective: Pt has no new complaints. No acute issues overnight.  Objective: Filed Vitals:   09/08/14 1052  BP: 135/81  Pulse: 48  Temp: 99.3 F (37.4 C)  Resp: 20    Intake/Output Summary (Last 24 hours) at 09/08/14 1704 Last data filed at 09/08/14 1432  Gross per 24 hour  Intake   1920 ml  Output      0 ml  Net   1920 ml   Filed Weights   09/05/14 2222 09/06/14 0319  Weight: 81.647 kg (180 lb) 77.248 kg (170 lb 4.8 oz)    Exam:   General:  Pt in nad, alert and awake  Cardiovascular: rrr, no mrg  Respiratory: cta bl, no wheezes  Abdomen: ruq discomfort with deep palpation, soft, ND  Musculoskeletal: no cyanosis or clubbing   Data Reviewed: Basic Metabolic Panel:  Recent Labs Lab 09/05/14 2303 09/06/14 0438 09/07/14 0538 09/08/14 0458  NA 144 143 140 140  K 3.5 4.2 3.6 3.6  CL 106 109 105 108  CO2 29 24 24 24   GLUCOSE 126* 113* 104* 97  BUN 8 10 12 12   CREATININE 0.86 0.66 0.68 0.66  CALCIUM 9.0 8.3* 8.1* 8.0*   Liver Function Tests:  Recent Labs Lab 09/05/14 2303 09/06/14 0438 09/07/14 0538 09/08/14 0458  AST 122* 96* 68* 50*  ALT 271* 230* 170* 132*  ALKPHOS 100 85 76 73  BILITOT 0.7 0.8 0.8 0.8  PROT 6.8 6.4* 5.9* 5.8*  ALBUMIN 4.2 3.8 3.5 3.4*     Recent Labs Lab 09/05/14 2303  LIPASE 25   No results for input(s): AMMONIA in the last 168 hours. CBC:  Recent Labs Lab 09/05/14 2303 09/06/14 0438 09/08/14 0458  WBC 5.8 5.9 4.2  NEUTROABS 4.2  --   --   HGB 14.3 13.2 11.8*  HCT 41.5 40.3 36.0*  MCV 87.0 89.4 87.6  PLT 181 154 140*   Cardiac Enzymes: No results for input(s): CKTOTAL, CKMB, CKMBINDEX, TROPONINI in the last 168 hours. BNP (last 3 results) No results for input(s): BNP in the last 8760 hours.  ProBNP (last 3 results) No results for input(s): PROBNP in the last 8760 hours.  CBG: No results for input(s): GLUCAP in the last 168 hours.  No results found for this or any previous visit (from the past 240 hour(s)).   Studies: No results found.  Scheduled Meds: . pantoprazole (PROTONIX) IV  40 mg Intravenous Q12H   Continuous Infusions: . sodium chloride 100 mL/hr at 09/08/14 1121    Time spent: > 35 minutes    Velvet Bathe  [facility] Pager [phone] If 7PM-7AM, please contact night-coverage at [email or web address], password [facility] 09/08/2014, 5:04 PM  LOS: 2 days

## 2014-09-08 NOTE — Progress Notes (Signed)
Pt still co nausea and vomiting x 2, paged Dr Wendee Beavers may he have po pheneran, zofran ineffective.

## 2014-09-09 MED ORDER — FLEET ENEMA 7-19 GM/118ML RE ENEM
1.0000 | ENEMA | Freq: Once | RECTAL | Status: AC
  Administered 2014-09-10: 1 via RECTAL
  Filled 2014-09-09 (×2): qty 1

## 2014-09-09 NOTE — Consult Note (Signed)
[facility] Consult Note  Referring Provider: No ref. provider found Primary Care Physician:  No primary care provider on file. Primary Gastroenterologist:  Dr.  Laurel Dimmer Complaint: rectal bleeding and abdominal pain HPI: Cameron Morton is an 34 y.o. white  male  Who is transferred from [facility] after developing generalized abdominal cramping with profuse nausea and vomiting. He had mildly elevated liver function tests. He reportedly has a history of hepatitis C. He has noted small volume hematochezia with most of his stool since admission with no diarrhea. His nausea and vomiting of improved but denies abdominal pain persist. Abdominal CT scan did not show any obvious cause of his pain. The patient has a history of Nissen fundoplication and possible stomach ulcer. He is also status post cholecystectomy.I do not know how where he was diagnosed with hepatitis C.  Past Medical History  Diagnosis Date  . Hepatitis C   . Stomach ulcer 2012    History reviewed. No pertinent past surgical history.  Medications Prior to Admission  Medication Sig Dispense Refill  . acetaminophen (TYLENOL) 500 MG tablet Take 1,000 mg by mouth every 6 (six) hours as needed for mild pain.    Marland Kitchen alum & mag hydroxide-simeth (MAALOX/MYLANTA) 200-200-20 MG/5ML suspension Take 30 mLs by mouth every 6 (six) hours as needed for indigestion or heartburn.    . gabapentin (NEURONTIN) 600 MG tablet Take 600 mg by mouth 3 (three) times daily.    . methocarbamol (ROBAXIN) 500 MG tablet Take 1,000 mg by mouth 3 (three) times daily as needed for muscle spasms.    . Multiple Vitamin (MULTIVITAMIN WITH MINERALS) TABS tablet Take 1 tablet by mouth daily.    . promethazine (PHENERGAN) 25 MG/ML injection Inject 25 mg into the vein every 4 (four) hours as needed for nausea or vomiting.    Marland Kitchen QUEtiapine (SEROQUEL) 50 MG tablet Take 50 mg by mouth at bedtime as needed (sleep).      Allergies: No Known Allergies  No family  history on file.  Social History:  reports that he has been smoking Cigarettes.  He has been smoking about 0.50 packs per day. He does not have any smokeless tobacco history on file. He reports that he does not drink alcohol or use illicit drugs.  Review of Systems: negative except as above   Blood pressure 165/87, pulse 50, temperature 98.6 F (37 C), temperature source Oral, resp. rate 17, height 6' (1.829 m), weight 77.248 kg (170 lb 4.8 oz), SpO2 100 %. Head: Normocephalic, without obvious abnormality, atraumatic Neck: no adenopathy, no carotid bruit, no JVD, supple, symmetrical, trachea midline and thyroid not enlarged, symmetric, no tenderness/mass/nodules Resp: clear to auscultation bilaterally Cardio: regular rate and rhythm, S1, S2 normal, no murmur, click, rub or gallop GI: abdomen soft nondistended with normoactive bowel sounds. Mild diffuse. Umbilical tenderness without guarding or rebound Extremities: extremities normal, atraumatic, no cyanosis or edema  Results for orders placed or performed during the hospital encounter of 09/05/14 (from the past 48 hour(s))  Comprehensive metabolic panel     Status: Abnormal   Collection Time: 09/08/14  4:58 AM  Result Value Ref Range   Sodium 140 135 - 145 mmol/L   Potassium 3.6 3.5 - 5.1 mmol/L   Chloride 108 101 - 111 mmol/L   CO2 24 22 - 32 mmol/L   Glucose, Bld 97 65 - 99 mg/dL   BUN 12 6 - 20 mg/dL   Creatinine, Ser 0.66 0.61 - 1.24 mg/dL   Calcium 8.0 (  L) 8.9 - 10.3 mg/dL   Total Protein 5.8 (L) 6.5 - 8.1 g/dL   Albumin 3.4 (L) 3.5 - 5.0 g/dL   AST 50 (H) 15 - 41 U/L   ALT 132 (H) 17 - 63 U/L   Alkaline Phosphatase 73 38 - 126 U/L   Total Bilirubin 0.8 0.3 - 1.2 mg/dL   GFR calc non Af Amer >60 >60 mL/min   GFR calc Af Amer >60 >60 mL/min    Comment: (NOTE) The eGFR has been calculated using the CKD EPI equation. This calculation has not been validated in all clinical situations. eGFR's persistently <60 mL/min signify  possible Chronic Kidney Disease.    Anion gap 8 5 - 15  CBC     Status: Abnormal   Collection Time: 09/08/14  4:58 AM  Result Value Ref Range   WBC 4.2 4.0 - 10.5 K/uL   RBC 4.11 (L) 4.22 - 5.81 MIL/uL   Hemoglobin 11.8 (L) 13.0 - 17.0 g/dL   HCT 36.0 (L) 39.0 - 52.0 %   MCV 87.6 78.0 - 100.0 fL   MCH 28.7 26.0 - 34.0 pg   MCHC 32.8 30.0 - 36.0 g/dL   RDW 14.8 11.5 - 15.5 %   Platelets 140 (L) 150 - 400 K/uL   No results found.  Assessment: 1. Rectal bleeding suspect perianal but cannot rule out an acute proctitis 2. Nausea vomiting and abdominal pain etiology unclear 3. Reported history of hepatitis C Plan:  1. We'll plan flexible sigmoidoscopy sigmoidoscopy in the morning 2. Will check hepatitis C antibody and if positive hepatitis C RNA  Burrell Hodapp C 09/09/2014, 12:10 PM  Pager [phone] If no answer or after 5 PM call [phone]

## 2014-09-09 NOTE — Progress Notes (Signed)
Consent signed in chart for flex sig in the am.

## 2014-09-09 NOTE — Clinical Documentation Improvement (Signed)
Internal Medicine  Abnormal Lab/Test Results:  *hemoglobin 14.3, 13.2, 11.8 and occult blood positive  Possible Clinical Conditions associated with below indicators  *Iron deficiency anemia  *Acute blood loss anemia  Normal hemoglobin values*  Other Condition  Cannot Clinically Determine   Supporting Information: *EDP note states: "Blood in Stools", and "He states he's had blood in his stool for the past 2 days"   Please exercise your independent, professional judgment when responding. A specific answer is not anticipated or expected.   Thank You,   Carrolyn Meiers, RN [email or web address] [phone]

## 2014-09-09 NOTE — Progress Notes (Signed)
[facility] PROGRESS NOTE  Cameron Morton XIP:[id number] DOB: 07/14/1980 DOA: 09/05/2014 PCP: No primary care provider on file.  Brief Narrative: Patient is a 34 year old Caucasian male with history of peptic ulcer status post repair [facility] who presents complaining of nausea, vomiting, abdominal discomfort, blood in stools, and elevated liver enzymes. It is presumed that he developed acute hepatitis secondary to gabapentin use as this has been discontinued and his liver enzymes have trended down. Patient has been treated supportively but has continued to have abdominal discomfort and complains of some blood in stool and today complained of blood in emesis. As such consulted GI for further assistance and evaluation.  Assessment/Plan: Active Problems:   Acute hepatitis - Presumed to be secondary to Gabapentin use. Has been improving off of this medication - advance diet as tolerated - supportive therapy - continue protonix - CT scan reports no acute findings, including appendicitis - Reassess liver enzymes next am.  Abdominal pain, right upper quadrant - Most likely 2ary to above  Hematemesis/Blood in stools - monitor cbc levels - consulted GI for further evaluation and recommendations.   Code Status: full Family Communication: None at bedside Disposition Plan: Pending improvement in condition.   Consultants:  None  Procedures:  none  Antibiotics:  None  HPI/Subjective: Pt has no new complaints. No acute issues overnight.  Objective: Filed Vitals:   09/09/14 0451  BP: 165/87  Pulse: 50  Temp: 98.6 F (37 C)  Resp: 17    Intake/Output Summary (Last 24 hours) at 09/09/14 1653 Last data filed at 09/09/14 0900  Gross per 24 hour  Intake      0 ml  Output      0 ml  Net      0 ml   Filed Weights   09/05/14 2222 09/06/14 0319  Weight: 81.647 kg (180 lb) 77.248 kg (170 lb 4.8 oz)    Exam:   General:  Pt in nad, alert and awake  Cardiovascular:  rrr, no mrg  Respiratory: cta bl, no wheezes  Abdomen: ruq discomfort with deep palpation, soft, ND  Musculoskeletal: no cyanosis or clubbing   Data Reviewed: Basic Metabolic Panel:  Recent Labs Lab 09/05/14 2303 09/06/14 0438 09/07/14 0538 09/08/14 0458  NA 144 143 140 140  K 3.5 4.2 3.6 3.6  CL 106 109 105 108  CO2 29 24 24 24   GLUCOSE 126* 113* 104* 97  BUN 8 10 12 12   CREATININE 0.86 0.66 0.68 0.66  CALCIUM 9.0 8.3* 8.1* 8.0*   Liver Function Tests:  Recent Labs Lab 09/05/14 2303 09/06/14 0438 09/07/14 0538 09/08/14 0458  AST 122* 96* 68* 50*  ALT 271* 230* 170* 132*  ALKPHOS 100 85 76 73  BILITOT 0.7 0.8 0.8 0.8  PROT 6.8 6.4* 5.9* 5.8*  ALBUMIN 4.2 3.8 3.5 3.4*    Recent Labs Lab 09/05/14 2303  LIPASE 25   No results for input(s): AMMONIA in the last 168 hours. CBC:  Recent Labs Lab 09/05/14 2303 09/06/14 0438 09/08/14 0458  WBC 5.8 5.9 4.2  NEUTROABS 4.2  --   --   HGB 14.3 13.2 11.8*  HCT 41.5 40.3 36.0*  MCV 87.0 89.4 87.6  PLT 181 154 140*   Cardiac Enzymes: No results for input(s): CKTOTAL, CKMB, CKMBINDEX, TROPONINI in the last 168 hours. BNP (last 3 results) No results for input(s): BNP in the last 8760 hours.  ProBNP (last 3 results) No results for input(s): PROBNP in the last 8760 hours.  CBG: No  results for input(s): GLUCAP in the last 168 hours.  No results found for this or any previous visit (from the past 240 hour(s)).   Studies: No results found.  Scheduled Meds: . pantoprazole (PROTONIX) IV  40 mg Intravenous Q12H  . [START ON 09/10/2014] sodium phosphate  1 enema Rectal Once   Continuous Infusions: . sodium chloride 100 mL/hr (09/08/14 2109)    Time spent: > 35 minutes    Velvet Bathe  [facility] Pager [phone] If 7PM-7AM, please contact night-coverage at [email or web address], password [facility] 09/09/2014, 4:53 PM  LOS: 3 days

## 2014-09-10 ENCOUNTER — Encounter (HOSPITAL_COMMUNITY)
Admission: EM | Disposition: A | Discharge status: Home / Self Care | Payer: Self-pay | Source: Home / Self Care | Attending: Family Medicine

## 2014-09-10 DIAGNOSIS — K7200 Acute and subacute hepatic failure without coma: Secondary | ICD-10-CM

## 2014-09-10 DIAGNOSIS — R1110 Vomiting, unspecified: Secondary | ICD-10-CM

## 2014-09-10 DIAGNOSIS — K625 Hemorrhage of anus and rectum: Secondary | ICD-10-CM

## 2014-09-10 HISTORY — PX: FLEXIBLE SIGMOIDOSCOPY: SHX5431

## 2014-09-10 LAB — COMPREHENSIVE METABOLIC PANEL
ALBUMIN: 3.4 g/dL — AB (ref 3.5–5.0)
ALT: 89 U/L — AB (ref 17–63)
ANION GAP: 7 (ref 5–15)
AST: 35 U/L (ref 15–41)
Alkaline Phosphatase: 67 U/L (ref 38–126)
BUN: 7 mg/dL (ref 6–20)
CHLORIDE: 107 mmol/L (ref 101–111)
CO2: 26 mmol/L (ref 22–32)
CREATININE: 0.7 mg/dL (ref 0.61–1.24)
Calcium: 8 mg/dL — ABNORMAL LOW (ref 8.9–10.3)
GFR calc non Af Amer: >60 mL/min (ref >60)
GLUCOSE: 94 mg/dL (ref 65–99)
Potassium: 3.2 mmol/L — ABNORMAL LOW (ref 3.5–5.1)
SODIUM: 140 mmol/L (ref 135–145)
Total Bilirubin: 0.9 mg/dL (ref 0.3–1.2)
Total Protein: 5.8 g/dL — ABNORMAL LOW (ref 6.5–8.1)

## 2014-09-10 LAB — CBC
HCT: 36.5 % — ABNORMAL LOW (ref 39.0–52.0)
HEMOGLOBIN: 12.2 g/dL — AB (ref 13.0–17.0)
MCH: 28.8 pg (ref 26.0–34.0)
MCHC: 33.4 g/dL (ref 30.0–36.0)
MCV: 86.1 fL (ref 78.0–100.0)
Platelets: 135 10*3/uL — ABNORMAL LOW (ref 150–400)
RBC: 4.24 MIL/uL (ref 4.22–5.81)
RDW: 14.3 % (ref 11.5–15.5)
WBC: 3.7 10*3/uL — ABNORMAL LOW (ref 4.0–10.5)

## 2014-09-10 LAB — HEPATITIS PANEL, ACUTE
HCV Ab: >11 s/co ratio — ABNORMAL HIGH (ref 0.0–0.9)
HEP B C IGM: NEGATIVE
HEP B S AG: NEGATIVE
Hep A IgM: NEGATIVE

## 2014-09-10 SURGERY — SIGMOIDOSCOPY, FLEXIBLE
Anesthesia: Moderate Sedation

## 2014-09-10 MED ORDER — SODIUM CHLORIDE 0.9 % IV SOLN
INTRAVENOUS | Status: DC
  Administered 2014-09-10: 500 mL via INTRAVENOUS

## 2014-09-10 MED ORDER — MIDAZOLAM HCL 5 MG/ML IJ SOLN
INTRAMUSCULAR | Status: AC
  Filled 2014-09-10: qty 2

## 2014-09-10 MED ORDER — MIDAZOLAM HCL 10 MG/2ML IJ SOLN
INTRAMUSCULAR | Status: DC | PRN
  Administered 2014-09-10 (×4): 2 mg via INTRAVENOUS

## 2014-09-10 MED ORDER — PROMETHAZINE HCL 25 MG/ML IJ SOLN: 12.5000 mg | Freq: Four times a day (QID) | INTRAMUSCULAR | Status: DC | PRN

## 2014-09-10 MED ORDER — DIAZEPAM 5 MG/ML IJ SOLN: 2.5000 mg | Freq: Three times a day (TID) | INTRAMUSCULAR | Status: DC | PRN

## 2014-09-10 MED ORDER — DIPHENHYDRAMINE HCL 50 MG/ML IJ SOLN
INTRAMUSCULAR | Status: AC
  Filled 2014-09-10: qty 1

## 2014-09-10 MED ORDER — FENTANYL CITRATE (PF) 100 MCG/2ML IJ SOLN
INTRAMUSCULAR | Status: AC
  Filled 2014-09-10: qty 2

## 2014-09-10 MED ORDER — FENTANYL CITRATE (PF) 100 MCG/2ML IJ SOLN
INTRAMUSCULAR | Status: DC | PRN
  Administered 2014-09-10 (×3): 25 ug via INTRAVENOUS

## 2014-09-10 MED ORDER — PROMETHAZINE HCL 25 MG/ML IJ SOLN
12.5000 mg | Freq: Once | INTRAMUSCULAR | Status: AC
  Administered 2014-09-10: 12.5 mg via INTRAVENOUS
  Filled 2014-09-10: qty 1

## 2014-09-10 MED ORDER — DIPHENHYDRAMINE HCL 50 MG/ML IJ SOLN
INTRAMUSCULAR | Status: DC | PRN
  Administered 2014-09-10 (×2): 25 mg via INTRAVENOUS

## 2014-09-10 NOTE — Progress Notes (Signed)
[facility] PROGRESS NOTE  Cameron Morton MCN:[id number] DOB: 08-Apr-1980 DOA: 09/05/2014  PCP: None  Brief HPI: Patient is a 34 year old Caucasian male with history of peptic ulcer status post repair who presents complaining of nausea, vomiting, abdominal discomfort, blood in stools, and elevated liver enzymes. It is presumed that he developed acute hepatitis secondary to gabapentin use as this has been discontinued and his liver enzymes have trended down. Patient has been treated supportively but has continued to have abdominal discomfort and complains of some blood in stool and today complained of blood in emesis.   Past medical history:  Past Medical History  Diagnosis Date  . Hepatitis C   . Stomach ulcer 2012    Consultants: [facility]  Procedures: Flexible sigmoidoscopy 8/24  Antibiotics: None  Subjective: Patient continues to have nausea and vomiting. Complains of upper abdominal pain. No further blood in stool. Patient was getting detoxed from opiates in [place].  Objective: Vital Signs  Filed Vitals:   09/10/14 1015 09/10/14 1025 09/10/14 1030 09/10/14 1035  BP: 137/72 131/64 131/64 139/65  Pulse: 53 53 51 50  Temp:  98.1 F (36.7 C)    TempSrc:  Oral    Resp: 16 14 14 15   Height:      Weight:      SpO2: 98% 100% 98% 98%   No intake or output data in the 24 hours ending 09/10/14 1214 Filed Weights   09/05/14 2222 09/06/14 0319  Weight: 81.647 kg (180 lb) 77.248 kg (170 lb 4.8 oz)    General appearance: alert, cooperative, appears stated age and no distress Resp: clear to auscultation bilaterally Cardio: regular rate and rhythm, S1, S2 normal, no murmur, click, rub or gallop GI: Soft. Mildly tender in the epigastric area without any rebound, rigidity or guarding. No masses or organomegaly. Bowel sounds are present. 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 Neurologic: No focal deficits  Lab Results:  Basic Metabolic Panel:  Recent Labs Lab 09/05/14 2303 09/06/14 0438 09/07/14 0538 09/08/14 0458 09/10/14 0545  NA 144 143 140 140 140  K 3.5 4.2 3.6 3.6 3.2*  CL 106 109 105 108 107  CO2 29 24 24 24 26   GLUCOSE 126* 113* 104* 97 94  BUN 8 10 12 12 7   CREATININE 0.86 0.66 0.68 0.66 0.70  CALCIUM 9.0 8.3* 8.1* 8.0* 8.0*   Liver Function Tests:  Recent Labs Lab 09/05/14 2303 09/06/14 0438 09/07/14 0538 09/08/14 0458 09/10/14 0545  AST 122* 96* 68* 50* 35  ALT 271* 230* 170* 132* 89*  ALKPHOS 100 85 76 73 67  BILITOT 0.7 0.8 0.8 0.8 0.9  PROT 6.8 6.4* 5.9* 5.8* 5.8*  ALBUMIN 4.2 3.8 3.5 3.4* 3.4*    Recent Labs Lab 09/05/14 2303  LIPASE 25   CBC:  Recent Labs Lab 09/05/14 2303 09/06/14 0438 09/08/14 0458 09/10/14 0545  WBC 5.8 5.9 4.2 3.7*  NEUTROABS 4.2  --   --   --   HGB 14.3 13.2 11.8* 12.2*  HCT 41.5 40.3 36.0* 36.5*  MCV 87.0 89.4 87.6 86.1  PLT 181 154 140* 135*    Studies/Results: No results found.  Medications:  Scheduled: . pantoprazole (PROTONIX) IV  40 mg Intravenous Q12H  . promethazine  12.5 mg Intravenous Once   Continuous: . sodium chloride 100 mL/hr at 09/10/14 0128   OQH:UTML & mag hydroxide-simeth, cloNIDine, diazepam, dicyclomine, fentaNYL (SUBLIMAZE) injection, methocarbamol,  metoCLOPramide (REGLAN) injection, ondansetron **OR** ondansetron (ZOFRAN) IV, oxyCODONE, promethazine, senna-docusate  Assessment/Plan:  Active Problems:   Acute hepatitis   Abdominal pain, right upper quadrant   Vomiting   GI bleed    Acute hepatitis - Presumed to be secondary to Gabapentin use. Has been improving off of this medication - Hepatitis C antibody is positive. Obtain genotype and quantitative RNA.  Nausea and vomiting No further hematemesis noted. CT of the abdomen, pelvis did not show any acute findings. His presentation could be due to opiate withdrawal. Treat  supportively. He is already on clonidine. Utilize Phenergan and diazepam. Continue PPI. He is status post cholecystectomy.  Questionable GI bleed Initially noted to have rectal bleeding. Gastroenterology was consulted. Patient underwent flexible sigmoidoscopy today, which revealed a sigmoid polyp. He will need colonoscopy eventually. No further episodes of bleeding.  Mild anemia, likely due to acute blood loss Secondary to rectal bleeding. Continue to monitor hemoglobin.  History of chronic pain Patient was on opiate for many years. Recently, he decided to come off of it and hence was admitted to [place].  DVT Prophylaxis: SCDs    Code Status: Full code  Family Communication: Discussed with the patient  Disposition Plan: Await improvement.    LOS: 4 days   [facility] Pager [phone] 09/10/2014, 12:14 PM  If 7PM-7AM, please contact night-coverage at [email or web address], password [facility]

## 2014-09-10 NOTE — Progress Notes (Signed)
[facility] Progress Note  Subjective: Patient still complaining of nausea and vomiting no further rectal bleeding.  Objective: Vital signs in last 24 hours: Temp:  [98.3 F (36.8 C)-99.1 F (37.3 C)] 98.3 F (36.8 C) (08/24 0904) Pulse Rate:  [47-57] 53 (08/24 1015) Resp:  [12-18] 16 (08/24 1015) BP: (106-155)/(60-88) 137/72 mmHg (08/24 1015) SpO2:  [98 %-100 %] 98 % (08/24 1015) Weight change:    PE: Appears comfortable  Lab Results: Results for orders placed or performed during the hospital encounter of 09/05/14 (from the past 24 hour(s))  Hepatitis panel, acute     Status: Abnormal   Collection Time: 09/09/14  2:45 PM  Result Value Ref Range   Hepatitis B Surface Ag Negative Negative   HCV Ab >11.0 (H) 0.0 - 0.9 s/co ratio   Hep A IgM Negative Negative   Hep B C IgM Negative Negative  Comprehensive metabolic panel     Status: Abnormal   Collection Time: 09/10/14  5:45 AM  Result Value Ref Range   Sodium 140 135 - 145 mmol/L   Potassium 3.2 (L) 3.5 - 5.1 mmol/L   Chloride 107 101 - 111 mmol/L   CO2 26 22 - 32 mmol/L   Glucose, Bld 94 65 - 99 mg/dL   BUN 7 6 - 20 mg/dL   Creatinine, Ser 0.70 0.61 - 1.24 mg/dL   Calcium 8.0 (L) 8.9 - 10.3 mg/dL   Total Protein 5.8 (L) 6.5 - 8.1 g/dL   Albumin 3.4 (L) 3.5 - 5.0 g/dL   AST 35 15 - 41 U/L   ALT 89 (H) 17 - 63 U/L   Alkaline Phosphatase 67 38 - 126 U/L   Total Bilirubin 0.9 0.3 - 1.2 mg/dL   GFR calc non Af Amer >60 >60 mL/min   GFR calc Af Amer >60 >60 mL/min   Anion gap 7 5 - 15  CBC     Status: Abnormal   Collection Time: 09/10/14  5:45 AM  Result Value Ref Range   WBC 3.7 (L) 4.0 - 10.5 K/uL   RBC 4.24 4.22 - 5.81 MIL/uL   Hemoglobin 12.2 (L) 13.0 - 17.0 g/dL   HCT 36.5 (L) 39.0 - 52.0 %   MCV 86.1 78.0 - 100.0 fL   MCH 28.8 26.0 - 34.0 pg   MCHC 33.4 30.0 - 36.0 g/dL   RDW 14.3 11.5 - 15.5 %   Platelets 135 (L) 150 - 400 K/uL    Studies/Results: No results found.    Assessment: Rectal  bleeding with flexible sigmoidoscopy showing a large sigmoid polyp. Could not removed due to inadequate prep concerns of combustion with cautery. Hepatitis C. Nausea and vomiting unclear etiology.  Plan: Will obtain hepatitis C RNA Will need full colonoscopy eventually probably as an outpatient doubt he would tolerate prep given the persistent nausea and vomiting.    Jomes Giraldo C 09/10/2014, 10:22 AM  Pager [phone] If no answer or after 5 PM call [phone]

## 2014-09-10 NOTE — Op Note (Signed)
[facility] [place], [id number]   FLEXIBLE SIGMOIDOSCOPY PROCEDURE REPORT  PATIENT: Cameron Morton, Cameron Morton  MR#: [id number] BIRTHDATE: Oct 01, 1980 , 34  yrs. old GENDER: male ENDOSCOPIST: Teena Irani, MD REFERRED BY: PROCEDURE DATE:  09/10/2014 PROCEDURE: ASA CLASS: INDICATIONS:rectal bleeding MEDICATIONS: Benadryl 50 g, Versed 6 mg, fentanyl 75 g.  DESCRIPTION OF PROCEDURE:   After the risks benefits and alternatives of the procedure were thoroughly explained, informed consent was obtained.     The     endoscope was introduced through the anus  and advanced to the    , The exam was Without limitations.    The quality of the prep was    . Estimated blood loss is zero unless otherwise noted in this procedure report. The instrument was then slowly withdrawn as the mucosa was fully examined.       the scope was advanced to about 80 cm in the transverse colon at which point solid stool was encountered. There is Harriette Ohara of most of the transverse colon and descending colon in the sigmoid and rectum were very well-prepped. There is a 1.4 cm pedunculated polyp in the proximal sigmoid colon. Due to the lack of adequate prep I did not snare it . Otherwise the sigmoid and rectum appeared completely normal with no evidence of colitis or any other abnormality. Retroflex view of the anus revealed only minimal internal hemorrhoids.             The scope was then withdrawn from the patient and the procedure terminated.  COMPLICATIONS: There were no immediate complications.  ENDOSCOPIC IMPRESSION: sigmoid polyp otherwise normal extended sigmoidoscopy  RECOMMENDATIONS: Will need fully prepped colonoscopy for removal of this. Can probably perform this as an outpatient. Patient having nausea and vomiting currently and doubt would tolerate prep  REPEAT EXAM:  eSigned:  Teena Irani, MD 09/10/2014 10:18 AM   CC:

## 2014-09-11 ENCOUNTER — Encounter (HOSPITAL_COMMUNITY): Payer: Self-pay | Admitting: Gastroenterology

## 2014-09-11 DIAGNOSIS — E876 Hypokalemia: Secondary | ICD-10-CM

## 2014-09-11 LAB — HCV RNA QUANT
HCV Quantitative Log: 5.403 log10 IU/mL (ref >1.70)
HCV Quantitative: 253000 IU/mL (ref >50)

## 2014-09-11 LAB — COMPREHENSIVE METABOLIC PANEL
ALBUMIN: 3.5 g/dL (ref 3.5–5.0)
ALK PHOS: 67 U/L (ref 38–126)
ALT: 75 U/L — AB (ref 17–63)
AST: 27 U/L (ref 15–41)
Anion gap: 12 (ref 5–15)
BILIRUBIN TOTAL: 1 mg/dL (ref 0.3–1.2)
CALCIUM: 8.1 mg/dL — AB (ref 8.9–10.3)
CO2: 26 mmol/L (ref 22–32)
CREATININE: 0.71 mg/dL (ref 0.61–1.24)
Chloride: 100 mmol/L — ABNORMAL LOW (ref 101–111)
GFR calc Af Amer: >60 mL/min (ref >60)
GLUCOSE: 78 mg/dL (ref 65–99)
POTASSIUM: 2.8 mmol/L — AB (ref 3.5–5.1)
Sodium: 138 mmol/L (ref 135–145)
TOTAL PROTEIN: 5.9 g/dL — AB (ref 6.5–8.1)

## 2014-09-11 LAB — BASIC METABOLIC PANEL
ANION GAP: 14 (ref 5–15)
BUN: <5 mg/dL — ABNORMAL LOW (ref 6–20)
CALCIUM: 8.1 mg/dL — AB (ref 8.9–10.3)
CO2: 24 mmol/L (ref 22–32)
Chloride: 98 mmol/L — ABNORMAL LOW (ref 101–111)
Creatinine, Ser: 0.79 mg/dL (ref 0.61–1.24)
Glucose, Bld: 66 mg/dL (ref 65–99)
POTASSIUM: 3 mmol/L — AB (ref 3.5–5.1)
Sodium: 136 mmol/L (ref 135–145)

## 2014-09-11 LAB — CBC
HEMATOCRIT: 35.5 % — AB (ref 39.0–52.0)
HEMOGLOBIN: 12.5 g/dL — AB (ref 13.0–17.0)
MCH: 29.8 pg (ref 26.0–34.0)
MCHC: 35.2 g/dL (ref 30.0–36.0)
MCV: 84.5 fL (ref 78.0–100.0)
Platelets: 145 10*3/uL — ABNORMAL LOW (ref 150–400)
RBC: 4.2 MIL/uL — AB (ref 4.22–5.81)
RDW: 14 % (ref 11.5–15.5)
WBC: 4.2 10*3/uL (ref 4.0–10.5)

## 2014-09-11 LAB — MAGNESIUM: MAGNESIUM: 1.7 mg/dL (ref 1.7–2.4)

## 2014-09-11 MED ORDER — POTASSIUM CHLORIDE CRYS ER 20 MEQ PO TBCR
40.0000 meq | EXTENDED_RELEASE_TABLET | ORAL | Status: AC
  Administered 2014-09-11: 40 meq via ORAL
  Filled 2014-09-11: qty 2

## 2014-09-11 MED ORDER — POTASSIUM CHLORIDE CRYS ER 20 MEQ PO TBCR
40.0000 meq | EXTENDED_RELEASE_TABLET | Freq: Once | ORAL | Status: AC
  Administered 2014-09-11: 40 meq via ORAL
  Filled 2014-09-11: qty 2

## 2014-09-11 MED ORDER — POTASSIUM CHLORIDE IN NACL 20-0.9 MEQ/L-% IV SOLN
INTRAVENOUS | Status: DC
  Administered 2014-09-11 (×2): via INTRAVENOUS
  Filled 2014-09-11 (×5): qty 1000

## 2014-09-11 NOTE — Progress Notes (Deleted)
.  jc

## 2014-09-11 NOTE — Progress Notes (Signed)
[facility] PROGRESS NOTE  Izea Livolsi AST:[id number] DOB: 03-01-1980 DOA: 09/05/2014  PCP: None  Brief HPI: Patient is a 34 year old Caucasian male with history of peptic ulcer status post repair who presents complaining of nausea, vomiting, abdominal discomfort, blood in stools, and elevated liver enzymes. Patient was admitted to the hospital. Gastroenterology was consulted. He underwent flexible sigmoidoscopy. Sigmoid polyp was found. Plan is for colonoscopy as an outpatient.  Past medical history:  Past Medical History  Diagnosis Date  . Hepatitis C   . Stomach ulcer 2012    Consultants: [facility]  Procedures:  Flexible sigmoidoscopy 8/24  EGD scheduled for today  Antibiotics: None  Subjective: Patient continues to have nausea. Continues to vomit. Continues to have upper abdominal pain. No further blood in stool. Patient was getting detoxed from opiates in [place].  Objective: Vital Signs  Filed Vitals:   09/10/14 1035 09/10/14 1708 09/10/14 2016 09/11/14 0432  BP: 139/65 146/78 144/75 138/79  Pulse: 50 59 61 58  Temp:  98.6 F (37 C) 98.2 F (36.8 C) 98.5 F (36.9 C)  TempSrc:  Oral Oral Oral  Resp: 15 16 16 16   Height:      Weight:   77.157 kg (170 lb 1.6 oz)   SpO2: 98% 99% 100% 99%    Intake/Output Summary (Last 24 hours) at 09/11/14 1018 Last data filed at 09/11/14 0600  Gross per 24 hour  Intake   7680 ml  Output      0 ml  Net   7680 ml   Filed Weights   09/05/14 2222 09/06/14 0319 09/10/14 2016  Weight: 81.647 kg (180 lb) 77.248 kg (170 lb 4.8 oz) 77.157 kg (170 lb 1.6 oz)    General appearance: alert, cooperative, appears stated age and no distress Resp: clear to auscultation bilaterally Cardio: regular rate and rhythm, S1, S2 normal, no murmur, click, rub or gallop GI: Soft. Mildly tender in the epigastric area without any rebound, rigidity or guarding. No masses or organomegaly. Bowel sounds are  present. 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 Neurologic: No focal deficits  Lab Results:  Basic Metabolic Panel:  Recent Labs Lab 09/06/14 0438 09/07/14 0538 09/08/14 0458 09/10/14 0545 09/11/14 0649  NA 143 140 140 140 138  K 4.2 3.6 3.6 3.2* 2.8*  CL 109 105 108 107 100*  CO2 24 24 24 26 26   GLUCOSE 113* 104* 97 94 78  BUN 10 12 12 7  <5*  CREATININE 0.66 0.68 0.66 0.70 0.71  CALCIUM 8.3* 8.1* 8.0* 8.0* 8.1*   Liver Function Tests:  Recent Labs Lab 09/06/14 0438 09/07/14 0538 09/08/14 0458 09/10/14 0545 09/11/14 0649  AST 96* 68* 50* 35 27  ALT 230* 170* 132* 89* 75*  ALKPHOS 85 76 73 67 67  BILITOT 0.8 0.8 0.8 0.9 1.0  PROT 6.4* 5.9* 5.8* 5.8* 5.9*  ALBUMIN 3.8 3.5 3.4* 3.4* 3.5    Recent Labs Lab 09/05/14 2303  LIPASE 25   CBC:  Recent Labs Lab 09/05/14 2303 09/06/14 0438 09/08/14 0458 09/10/14 0545 09/11/14 0649  WBC 5.8 5.9 4.2 3.7* 4.2  NEUTROABS 4.2  --   --   --   --   HGB 14.3 13.2 11.8* 12.2* 12.5*  HCT 41.5 40.3 36.0* 36.5* 35.5*  MCV 87.0 89.4 87.6 86.1 84.5  PLT 181 154 140* 135* 145*    Studies/Results: No results found.  Medications:  Scheduled: .  pantoprazole (PROTONIX) IV  40 mg Intravenous Q12H  . potassium chloride  40 mEq Oral Once   Continuous: . 0.9 % NaCl with KCl 20 mEq / L     YFV:CBSW & mag hydroxide-simeth, cloNIDine, diazepam, dicyclomine, fentaNYL (SUBLIMAZE) injection, methocarbamol, metoCLOPramide (REGLAN) injection, ondansetron **OR** ondansetron (ZOFRAN) IV, oxyCODONE, promethazine, senna-docusate  Assessment/Plan:  Active Problems:   Acute hepatitis   Abdominal pain, right upper quadrant   Vomiting   GI bleed    Acute hepatitis/hepatitis C Presumed to be secondary to Gabapentin use. Has been improving off of this medication. Hepatitis C antibody is positive. Genotype and quantitative RNA is pending. Patient tells  me that he knows of this diagnosis.  Nausea and vomiting No further hematemesis noted. CT of the abdomen, pelvis did not show any acute findings. His presentation could be due to opiate withdrawal. With the patient continues to have nausea and vomiting. GI as planned for an EGD this afternoon. This is reasonable to rule out other pathology. Continue PPI. He is already on clonidine. Utilize Phenergan and diazepam. He is status post cholecystectomy.  Severe hypokalemia Repleted aggressively. Check Magnesium.  Rectal bleed/sigmoid polyp on sigmoidoscopy Initially noted to have rectal bleeding. Gastroenterology was consulted. Patient underwent flexible sigmoidoscopy, which revealed a sigmoid polyp. He will need colonoscopy eventually, but this could be done as an outpatient. No further episodes of bleeding.  Mild anemia, likely due to acute blood loss Secondary to rectal bleeding. Continue to monitor hemoglobin.  History of chronic pain Patient was on opiate for many years. Recently, he decided to come off of it and hence was in [employer].  DVT Prophylaxis: SCDs    Code Status: Full code  Family Communication: Discussed with the patient  Disposition Plan: Await nausea and vomiting to subside. EGD planned for today.    LOS: 5 days   [facility] Pager [phone] 09/11/2014, 10:18 AM  If 7PM-7AM, please contact night-coverage at [email or web address], password [email or web address]

## 2014-09-11 NOTE — Progress Notes (Signed)
[facility] Progress Note  Subjective: The patient still complaining of nausea and vomiting with vomitus and the emesis basin, no more rectal bleeding  Objective: Vital signs in last 24 hours: Temp:  [98.1 F (36.7 C)-98.6 F (37 C)] 98.5 F (36.9 C) (08/25 0432) Pulse Rate:  [50-61] 58 (08/25 0432) Resp:  [12-18] 16 (08/25 0432) BP: (106-148)/(60-86) 138/79 mmHg (08/25 0432) SpO2:  [98 %-100 %] 99 % (08/25 0432) Weight:  [77.157 kg (170 lb 1.6 oz)] 77.157 kg (170 lb 1.6 oz) (08/24 2016) Weight change:    PE: Unchanged  Lab Results: Results for orders placed or performed during the hospital encounter of 09/05/14 (from the past 24 hour(s))  CBC     Status: Abnormal   Collection Time: 09/11/14  6:49 AM  Result Value Ref Range   WBC 4.2 4.0 - 10.5 K/uL   RBC 4.20 (L) 4.22 - 5.81 MIL/uL   Hemoglobin 12.5 (L) 13.0 - 17.0 g/dL   HCT 35.5 (L) 39.0 - 52.0 %   MCV 84.5 78.0 - 100.0 fL   MCH 29.8 26.0 - 34.0 pg   MCHC 35.2 30.0 - 36.0 g/dL   RDW 14.0 11.5 - 15.5 %   Platelets 145 (L) 150 - 400 K/uL  Comprehensive metabolic panel     Status: Abnormal   Collection Time: 09/11/14  6:49 AM  Result Value Ref Range   Sodium 138 135 - 145 mmol/L   Potassium 2.8 (L) 3.5 - 5.1 mmol/L   Chloride 100 (L) 101 - 111 mmol/L   CO2 26 22 - 32 mmol/L   Glucose, Bld 78 65 - 99 mg/dL   BUN <5 (L) 6 - 20 mg/dL   Creatinine, Ser 0.71 0.61 - 1.24 mg/dL   Calcium 8.1 (L) 8.9 - 10.3 mg/dL   Total Protein 5.9 (L) 6.5 - 8.1 g/dL   Albumin 3.5 3.5 - 5.0 g/dL   AST 27 15 - 41 U/L   ALT 75 (H) 17 - 63 U/L   Alkaline Phosphatase 67 38 - 126 U/L   Total Bilirubin 1.0 0.3 - 1.2 mg/dL   GFR calc non Af Amer >60 >60 mL/min   GFR calc Af Amer >60 >60 mL/min   Anion gap 12 5 - 15    Studies/Results: No results found.    Assessment: 1. Hepatitis C, 2. Polysubstance abuse 3. Nausea and vomiting 4. Sigmoid colon polyp, not resected  Plan: 1. Will proceed with EGD to workup nausea and  vomiting 2. Colonoscopy at some point but unable to prep due to vomiting at present 3. Hepatitis C to be evaluated at [facility].    Quitman Norberto C 09/11/2014, 8:55 AM  Pager [phone] If no answer or after 5 PM call [phone]

## 2014-09-12 ENCOUNTER — Encounter (HOSPITAL_COMMUNITY)
Admission: EM | Disposition: A | Discharge status: Home / Self Care | Payer: Self-pay | Source: Home / Self Care | Attending: Family Medicine

## 2014-09-12 ENCOUNTER — Encounter (HOSPITAL_COMMUNITY): Payer: Self-pay | Admitting: *Deleted

## 2014-09-12 ENCOUNTER — Inpatient Hospital Stay (HOSPITAL_COMMUNITY): Payer: BLUE CROSS/BLUE SHIELD | Admitting: Anesthesiology

## 2014-09-12 HISTORY — PX: ESOPHAGOGASTRODUODENOSCOPY: SHX5428

## 2014-09-12 LAB — BASIC METABOLIC PANEL
ANION GAP: 14 (ref 5–15)
BUN: <5 mg/dL — ABNORMAL LOW (ref 6–20)
CALCIUM: 8.4 mg/dL — AB (ref 8.9–10.3)
CO2: 24 mmol/L (ref 22–32)
Chloride: 98 mmol/L — ABNORMAL LOW (ref 101–111)
Creatinine, Ser: 0.8 mg/dL (ref 0.61–1.24)
Glucose, Bld: 70 mg/dL (ref 65–99)
Potassium: 3.4 mmol/L — ABNORMAL LOW (ref 3.5–5.1)
Sodium: 136 mmol/L (ref 135–145)

## 2014-09-12 LAB — MAGNESIUM: Magnesium: 1.8 mg/dL (ref 1.7–2.4)

## 2014-09-12 SURGERY — EGD (ESOPHAGOGASTRODUODENOSCOPY)
Anesthesia: Monitor Anesthesia Care

## 2014-09-12 MED ORDER — LACTATED RINGERS IV SOLN
INTRAVENOUS | Status: DC | PRN
  Administered 2014-09-12: 11:00:00 via INTRAVENOUS

## 2014-09-12 MED ORDER — POTASSIUM CHLORIDE 10 MEQ/100ML IV SOLN
10.0000 meq | INTRAVENOUS | Status: AC
  Administered 2014-09-12 (×2): 10 meq via INTRAVENOUS
  Filled 2014-09-12 (×2): qty 100

## 2014-09-12 MED ORDER — MIDAZOLAM HCL 5 MG/5ML IJ SOLN
INTRAMUSCULAR | Status: DC | PRN
  Administered 2014-09-12: 4 mg via INTRAVENOUS

## 2014-09-12 MED ORDER — LIDOCAINE HCL (CARDIAC) 20 MG/ML IV SOLN
INTRAVENOUS | Status: DC | PRN
  Administered 2014-09-12: 40 mg via INTRAVENOUS

## 2014-09-12 MED ORDER — POTASSIUM CHLORIDE 10 MEQ/100ML IV SOLN
10.0000 meq | INTRAVENOUS | Status: AC
  Administered 2014-09-12: 10 meq via INTRAVENOUS
  Filled 2014-09-12: qty 100

## 2014-09-12 MED ORDER — MAGNESIUM SULFATE 2 GM/50ML IV SOLN
2.0000 g | Freq: Once | INTRAVENOUS | Status: AC
  Administered 2014-09-12: 2 g via INTRAVENOUS
  Filled 2014-09-12: qty 50

## 2014-09-12 MED ORDER — FENTANYL CITRATE (PF) 100 MCG/2ML IJ SOLN
INTRAMUSCULAR | Status: DC | PRN
  Administered 2014-09-12: 100 ug via INTRAVENOUS
  Administered 2014-09-12: 150 ug via INTRAVENOUS
  Administered 2014-09-12 (×3): 50 ug via INTRAVENOUS

## 2014-09-12 MED ORDER — PROPOFOL 10 MG/ML IV BOLUS
INTRAVENOUS | Status: DC | PRN
  Administered 2014-09-12 (×2): 50 mg via INTRAVENOUS
  Administered 2014-09-12: 40 mg via INTRAVENOUS
  Administered 2014-09-12 (×2): 50 mg via INTRAVENOUS

## 2014-09-12 MED ORDER — SODIUM CHLORIDE 0.9 % IV SOLN
INTRAVENOUS | Status: DC
Start: 2014-09-12 — End: 2014-09-12

## 2014-09-12 MED ORDER — BUTAMBEN-TETRACAINE-BENZOCAINE 2-2-14 % EX AERO
INHALATION_SPRAY | CUTANEOUS | Status: DC | PRN
  Administered 2014-09-12: 2 via TOPICAL

## 2014-09-12 MED ORDER — SODIUM CHLORIDE 0.9 % IV SOLN
INTRAVENOUS | Status: DC
  Administered 2014-09-12: 13:00:00 via INTRAVENOUS
  Filled 2014-09-12 (×5): qty 1000

## 2014-09-12 NOTE — Op Note (Signed)
[facility] [place], [id number]   ENDOSCOPY PROCEDURE REPORT  PATIENT: Cameron, Morton  MR#: [id number] BIRTHDATE: [birth year] , 34  yrs. old GENDER: male ENDOSCOPIST: Teena Irani, MD REFERRED BY: PROCEDURE DATE:  2014/09/22 PROCEDURE: ASA CLASS: INDICATIONS:   refractory nausea and vomiting MEDICATIONS: MAC TOPICAL ANESTHETIC: cetacaine  DESCRIPTION OF PROCEDURE: After the risks benefits and alternatives of the procedure were thoroughly explained, informed consent was obtained.  The PENTAX GASTOROSCOPE [id number] endoscope was introduced through the mouth and advanced to the second portion of the duodenum , Without limitations.  The instrument was slowly withdrawn as the mucosa was fully examined. Estimated blood loss is zero unless otherwise noted in this procedure report.    esophagus: Normal Stomach: Proximal erythema on opposing greater and lesser curvature folds of the body consistent with emesis trauma, otherwise normal. Duodenum normal              The scope was then withdrawn from the patient and the procedure completed.  COMPLICATIONS: There were no immediate complications.  ENDOSCOPIC IMPRESSION: proximal gastric erythema felt trauma secondary to repeated retching, otherwise normal  RECOMMENDATIONS: suspect his nausea and vomiting is due to substance withdrawal. Continue symptomatic treatment.  REPEAT EXAM:  eSigned:  Teena Irani, MD 09/22/14 11:39 AM    CC:  CPT CODES: ICD CODES:  The ICD and CPT codes recommended by this software are interpretations from the data that the clinical staff has captured with the software.  The verification of the translation of this report to the ICD and CPT codes and modifiers is the sole responsibility of the health care institution and practicing physician where this report was generated.  [employer] will not be held responsible for the validity of the ICD  and CPT codes included on this report.  AMA assumes no liability for data contained or not contained herein. CPT is a Designer, television/film set of the [employer].  PATIENT NAME:  Cameron, Morton MR#: [id number]

## 2014-09-12 NOTE — Anesthesia Preprocedure Evaluation (Addendum)
Anesthesia Evaluation  Patient identified by MRN, date of birth, ID band Patient awake    Reviewed: Allergy & Precautions, H&P , NPO status , Patient's Chart, lab work & pertinent test results  Airway Mallampati: II  TM Distance: >3 FB Neck ROM: Full    Dental no notable dental hx. (+) Teeth Intact, Dental Advisory Given   Pulmonary Current Smoker,  breath sounds clear to auscultation  Pulmonary exam normal       Cardiovascular negative cardio ROS  Rhythm:Regular Rate:Normal     Neuro/Psych negative neurological ROS  negative psych ROS   GI/Hepatic PUD, GERD-  Medicated and Controlled,(+) Hepatitis -, C  Endo/Other  negative endocrine ROS  Renal/GU negative Renal ROS  negative genitourinary   Musculoskeletal   Abdominal   Peds  Hematology negative hematology ROS (+)   Anesthesia Other Findings   Reproductive/Obstetrics negative OB ROS                            Anesthesia Physical Anesthesia Plan  ASA: II  Anesthesia Plan: MAC   Post-op Pain Management:    Induction: Intravenous  Airway Management Planned: Nasal Cannula  Additional Equipment:   Intra-op Plan:   Post-operative Plan:   Informed Consent: I have reviewed the patients History and Physical, chart, labs and discussed the procedure including the risks, benefits and alternatives for the proposed anesthesia with the patient or authorized representative who has indicated his/her understanding and acceptance.   Dental advisory given  Plan Discussed with: CRNA  Anesthesia Plan Comments:         Anesthesia Quick Evaluation

## 2014-09-12 NOTE — Progress Notes (Signed)
Patient still complaining of nausea and vomiting this morning. EGD was done and was entirely normal except for proximal erythema consistent with emesis trauma. I suspect his vomiting is related to substance withdrawal. The patient needs follow-up with me for colonoscopy and removal of polyp. I will arrange for outpatient follow-up. We'll sign off for now.

## 2014-09-12 NOTE — Transfer of Care (Signed)
Immediate Anesthesia Transfer of Care Note  Patient: Takumi Din  Procedure(s) Performed: Procedure(s): ESOPHAGOGASTRODUODENOSCOPY (EGD) (N/A)  Patient Location: Endoscopy Unit  Anesthesia Type:MAC  Level of Consciousness: awake, alert  and oriented  Airway & Oxygen Therapy: Patient Spontanous Breathing and Patient connected to nasal cannula oxygen  Post-op Assessment: Report given to RN and Post -op Vital signs reviewed and stable  Post vital signs: Reviewed and stable  Last Vitals:  Filed Vitals:   09/12/14 1140  BP: 103/82  Pulse:   Temp: 37.2 C  Resp: 14    Complications: No apparent anesthesia complications

## 2014-09-12 NOTE — Anesthesia Postprocedure Evaluation (Signed)
  Anesthesia Post-op Note  Patient: Cameron Morton  Procedure(s) Performed: Procedure(s): ESOPHAGOGASTRODUODENOSCOPY (EGD) (N/A)  Patient Location: PACU  Anesthesia Type: MAC  Level of Consciousness: awake and alert   Airway and Oxygen Therapy: Patient Spontanous Breathing  Post-op Pain: Controlled  Post-op Assessment: Post-op Vital signs reviewed, Patient's Cardiovascular Status Stable and Respiratory Function Stable  Post-op Vital Signs: Reviewed  Filed Vitals:   09/12/14 1140  BP: 103/82  Pulse:   Temp: 37.2 C  Resp: 14    Complications: No apparent anesthesia complications

## 2014-09-12 NOTE — Progress Notes (Signed)
[facility] PROGRESS NOTE  Cameron Morton JOI:[id number] DOB: November 02, 1980 DOA: 09/05/2014  PCP: None  Brief HPI: Patient is a 34 year old Caucasian male with history of peptic ulcer status post repair who presents complaining of nausea, vomiting, abdominal discomfort, blood in stools, and elevated liver enzymes. Patient was admitted to the hospital. Gastroenterology was consulted. He underwent flexible sigmoidoscopy. Sigmoid polyp was found. Plan is for colonoscopy as an outpatient.  Past medical history:  Past Medical History  Diagnosis Date  . Hepatitis C   . Stomach ulcer 2012    Consultants: [facility]  Procedures:  Flexible sigmoidoscopy 8/24  EGD scheduled for today  Antibiotics: None  Subjective: Continues to have nausea and dry heaving, unable to eat anything or keep fluids down. Patient was getting detoxed from opiates in [place].  Objective: Vital Signs  Filed Vitals:   09/11/14 1810 09/11/14 2120 09/12/14 0518 09/12/14 1018  BP: 139/70 140/75 130/69 125/77  Pulse: 60 64 59 78  Temp: 98.3 F (36.8 C) 98.8 F (37.1 C) 98 F (36.7 C) 98.4 F (36.9 C)  TempSrc: Oral Oral Oral Oral  Resp: 18 17 18 18   Height:      Weight:  77.2 kg (170 lb 3.1 oz)    SpO2: 99% 98% 100% 100%    Intake/Output Summary (Last 24 hours) at 09/12/14 1021 Last data filed at 09/12/14 4709  Gross per 24 hour  Intake 2298.33 ml  Output      3 ml  Net 2295.33 ml   Filed Weights   09/06/14 0319 09/10/14 2016 09/11/14 2120  Weight: 77.248 kg (170 lb 4.8 oz) 77.157 kg (170 lb 1.6 oz) 77.2 kg (170 lb 3.1 oz)    General appearance: alert, cooperative, appears stated age and no distress Resp: clear to auscultation bilaterally Cardio: regular rate and rhythm, S1, S2 normal, no murmur, click, rub or gallop GI: Soft. Mildly tender in the epigastric area without any rebound, rigidity or guarding. No masses or organomegaly. Bowel sounds are present. 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 Neurologic: No focal deficits  Lab Results:  Basic Metabolic Panel:  Recent Labs Lab 09/08/14 0458 09/10/14 0545 09/11/14 0649 09/11/14 1444 09/12/14 0542  NA 140 140 138 136 136  K 3.6 3.2* 2.8* 3.0* 3.4*  CL 108 107 100* 98* 98*  CO2 24 26 26 24 24   GLUCOSE 97 94 78 66 70  BUN 12 7 <5* <5* <5*  CREATININE 0.66 0.70 0.71 0.79 0.80  CALCIUM 8.0* 8.0* 8.1* 8.1* 8.4*  MG  --   --  1.7  --  1.8   Liver Function Tests:  Recent Labs Lab 09/06/14 0438 09/07/14 0538 09/08/14 0458 09/10/14 0545 09/11/14 0649  AST 96* 68* 50* 35 27  ALT 230* 170* 132* 89* 75*  ALKPHOS 85 76 73 67 67  BILITOT 0.8 0.8 0.8 0.9 1.0  PROT 6.4* 5.9* 5.8* 5.8* 5.9*  ALBUMIN 3.8 3.5 3.4* 3.4* 3.5    Recent Labs Lab 09/05/14 2303  LIPASE 25   CBC:  Recent Labs Lab 09/05/14 2303 09/06/14 0438 09/08/14 0458 09/10/14 0545 09/11/14 0649  WBC 5.8 5.9 4.2 3.7* 4.2  NEUTROABS 4.2  --   --   --   --   HGB 14.3 13.2 11.8* 12.2* 12.5*  HCT 41.5 40.3 36.0* 36.5* 35.5*  MCV 87.0 89.4 87.6 86.1 84.5  PLT 181 154 140* 135* 145*  Studies/Results: No results found.  Medications:  Scheduled: . pantoprazole (PROTONIX) IV  40 mg Intravenous Q12H   Continuous: . sodium chloride    . sodium chloride 0.9 % 1,000 mL with potassium chloride 60 mEq infusion     IBB:CWUG & mag hydroxide-simeth, cloNIDine, diazepam, dicyclomine, methocarbamol, metoCLOPramide (REGLAN) injection, ondansetron **OR** ondansetron (ZOFRAN) IV, oxyCODONE, promethazine, senna-docusate  Assessment/Plan:  Active Problems:   Acute hepatitis   Abdominal pain, right upper quadrant   Vomiting   GI bleed    Acute hepatitis/hepatitis C Presumed to be secondary to Gabapentin use. Has been improving off of this medication. Hepatitis C antibody is positive. Genotype and quantitative RNA is pending. Has an appointment at  [facility] with a hepatologist   Nausea and vomiting No further hematemesis noted. CT of the abdomen, pelvis did not show any acute findings. His presentation could be due to opiate withdrawal. With the patient continues to have nausea and vomiting. GI as planned for an EGD this afternoon. This is reasonable to rule out other pathology is the patient has a history of peptic ulcer disease. Continue PPI. He is already on clonidine. Utilize Phenergan and diazepam. He is status post cholecystectomy. Unable to prep for colonoscopy due to intractable nausea and vomiting  Severe hypokalemia Repleted aggressively. Check Magnesium.  Rectal bleed/sigmoid polyp on sigmoidoscopy Initially noted to have rectal bleeding. Gastroenterology was consulted. Patient underwent flexible sigmoidoscopy, which revealed a sigmoid polyp. He will need colonoscopy eventually, but this could be done as an outpatient. No further episodes of bleeding.  Mild anemia, likely due to acute blood loss Secondary to rectal bleeding. Continue to monitor hemoglobin.  History of chronic pain Patient was on opiate for many years. Recently, he decided to come off of it and hence was in [employer].  DVT Prophylaxis: SCDs    Code Status: Full code  Family Communication: Discussed with the patient  Disposition Plan: Await nausea and vomiting to subside. EGD planned for today.    LOS: 6 days   [facility] Pager [phone]  09/12/2014, 10:21 AM  If 7PM-7AM, please contact night-coverage at [email or web address], password [facility]

## 2014-09-13 LAB — COMPREHENSIVE METABOLIC PANEL
ALBUMIN: 3.3 g/dL — AB (ref 3.5–5.0)
ALT: 51 U/L (ref 17–63)
AST: 21 U/L (ref 15–41)
Alkaline Phosphatase: 62 U/L (ref 38–126)
Anion gap: 8 (ref 5–15)
CHLORIDE: 101 mmol/L (ref 101–111)
CO2: 28 mmol/L (ref 22–32)
Calcium: 8.3 mg/dL — ABNORMAL LOW (ref 8.9–10.3)
Creatinine, Ser: 0.67 mg/dL (ref 0.61–1.24)
GFR calc Af Amer: >60 mL/min (ref >60)
GLUCOSE: 99 mg/dL (ref 65–99)
POTASSIUM: 3.8 mmol/L (ref 3.5–5.1)
SODIUM: 137 mmol/L (ref 135–145)
Total Bilirubin: 0.9 mg/dL (ref 0.3–1.2)
Total Protein: 5.8 g/dL — ABNORMAL LOW (ref 6.5–8.1)

## 2014-09-13 LAB — HEPATITIS C GENOTYPE

## 2014-09-13 LAB — CBC
HEMATOCRIT: 38.5 % — AB (ref 39.0–52.0)
Hemoglobin: 13.2 g/dL (ref 13.0–17.0)
MCH: 29 pg (ref 26.0–34.0)
MCHC: 34.3 g/dL (ref 30.0–36.0)
MCV: 84.6 fL (ref 78.0–100.0)
Platelets: 165 10*3/uL (ref 150–400)
RBC: 4.55 MIL/uL (ref 4.22–5.81)
RDW: 14.6 % (ref 11.5–15.5)
WBC: 4.4 10*3/uL (ref 4.0–10.5)

## 2014-09-13 MED ORDER — PANTOPRAZOLE SODIUM 40 MG PO TBEC: 40.0000 mg | DELAYED_RELEASE_TABLET | Freq: Two times a day (BID) | ORAL | Status: DC

## 2014-09-13 MED ORDER — PROMETHAZINE HCL 12.5 MG PO TABS: 12.5000 mg | ORAL_TABLET | Freq: Four times a day (QID) | ORAL | Status: DC | PRN

## 2014-09-13 MED ORDER — PROMETHAZINE HCL 25 MG/ML IJ SOLN: 25.0000 mg | INTRAMUSCULAR | Status: DC | PRN

## 2014-09-13 MED ORDER — OXYCODONE HCL 5 MG PO TABS: 5.0000 mg | ORAL_TABLET | Freq: Three times a day (TID) | ORAL | Status: DC | PRN

## 2014-09-13 NOTE — Discharge Summary (Signed)
Physician Discharge Summary  Cameron Morton MRN: [id number] DOB/AGE: [birth year] 34 y.o.  PCP: No primary care provider on file.   Admit date: 09/05/2014 Discharge date: 09/13/2014  Discharge Diagnoses:   Active Problems:   Acute hepatitis   Abdominal pain, right upper quadrant   Vomiting   GI bleed  nausea secondary to opiod  withdrawal   Follow-up recommendations Follow-up with PCP in 3-5 days , including all  additional recommended appointments as below Follow-up CBC, CMP, magnesium in 3-5 days      Medication List    STOP taking these medications        gabapentin 600 MG tablet  Commonly known as:  NEURONTIN     QUEtiapine 50 MG tablet  Commonly known as:  SEROQUEL      TAKE these medications        acetaminophen 500 MG tablet  Commonly known as:  TYLENOL  Take 1,000 mg by mouth every 6 (six) hours as needed for mild pain.     alum & mag hydroxide-simeth 200-200-20 MG/5ML suspension  Commonly known as:  MAALOX/MYLANTA  Take 30 mLs by mouth every 6 (six) hours as needed for indigestion or heartburn.     methocarbamol 500 MG tablet  Commonly known as:  ROBAXIN  Take 1,000 mg by mouth 3 (three) times daily as needed for muscle spasms.     multivitamin with minerals Tabs tablet  Take 1 tablet by mouth daily.     oxyCODONE 5 MG immediate release tablet  Commonly known as:  Oxy IR/ROXICODONE  Take 1 tablet (5 mg total) by mouth every 8 (eight) hours as needed for moderate pain or breakthrough pain.     pantoprazole 40 MG tablet  Commonly known as:  PROTONIX  Take 1 tablet (40 mg total) by mouth 2 (two) times daily.     promethazine 25 MG/ML injection  Commonly known as:  PHENERGAN  Inject 1 mL (25 mg total) into the vein every 4 (four) hours as needed for nausea or vomiting.         Discharge Condition: Stable  Disposition: Anticipate discharge today   Consults:  Gastroenterology   Significant Diagnostic Studies:  Ct Abdomen Pelvis W  Contrast  09/06/2014   CLINICAL DATA:  Mid epigastric abdominal pain with fever, nausea, and vomiting.  EXAM: CT ABDOMEN AND PELVIS WITH CONTRAST  TECHNIQUE: Multidetector CT imaging of the abdomen and pelvis was performed using the standard protocol following bolus administration of intravenous contrast.  CONTRAST:  147m OMNIPAQUE IOHEXOL 300 MG/ML  SOLN  COMPARISON:  None.  FINDINGS: BODY WALL: Prior upper midline laparotomy.  LOWER CHEST: Subpleural scarring in the right lower lobe, mild.  ABDOMEN/PELVIS:  Liver: No focal abnormality.  Biliary: Cholecystectomy.  Pancreas: Unremarkable.  Spleen: Unremarkable.  Adrenals: Unremarkable.  Kidneys and ureters: Punctate nonobstructive stones in the bilateral kidneys. No ureteral calculus or hydronephrosis.  Bladder: Unremarkable.  Reproductive: No pathologic findings.  Bowel: No definite bowel wall thickening when accounting for under distention. No bowel obstruction. No appendicitis.  Retroperitoneum: Enlarged lymph nodes in the deep liver drainage, usually incidental in isolation.  Peritoneum: No ascites or pneumoperitoneum.  Vascular: Diminutive right lower extremity arterial and venous system in the setting of marked atrophy.  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  L1-L2 interbody ankylosis.  IMPRESSION: 1. No acute finding, including appendicitis. 2. 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 3. Cholecystectomy. 4. Tiny bilateral renal calculi.   Electronically Signed   By: Monte Fantasia M.D.   On: 09/06/2014 01:06    EGD ENDOSCOPIC IMPRESSION: proximal gastric erythema felt trauma secondary to repeated retching, otherwise normal  RECOMMENDATIONS: suspect his nausea and vomiting is due to substance withdrawal. Continue symptomatic treatment.   Filed Weights   09/10/14 2016 09/11/14  2120 09/12/14 2000  Weight: 77.157 kg (170 lb 1.6 oz) 77.2 kg (170 lb 3.1 oz) 76.204 kg (168 lb)     Microbiology: No results found for this or any previous visit (from the past 240 hour(s)).     Blood Culture No results found for: SDES, SPECREQUEST, CULT, REPTSTATUS    Labs: Results for orders placed or performed during the hospital encounter of 09/05/14 (from the past 48 hour(s))  Basic metabolic panel     Status: Abnormal   Collection Time: 09/11/14  2:44 PM  Result Value Ref Range   Sodium 136 135 - 145 mmol/L   Potassium 3.0 (L) 3.5 - 5.1 mmol/L   Chloride 98 (L) 101 - 111 mmol/L   CO2 24 22 - 32 mmol/L   Glucose, Bld 66 65 - 99 mg/dL   BUN <5 (L) 6 - 20 mg/dL   Creatinine, Ser 0.79 0.61 - 1.24 mg/dL   Calcium 8.1 (L) 8.9 - 10.3 mg/dL   GFR calc non Af Amer >60 >60 mL/min   GFR calc Af Amer >60 >60 mL/min    Comment: (NOTE) The eGFR has been calculated using the CKD EPI equation. This calculation has not been validated in all clinical situations. eGFR's persistently <60 mL/min signify possible Chronic Kidney Disease.    Anion gap 14 5 - 15  Magnesium     Status: None   Collection Time: 09/12/14  5:42 AM  Result Value Ref Range   Magnesium 1.8 1.7 - 2.4 mg/dL  Basic metabolic panel     Status: Abnormal   Collection Time: 09/12/14  5:42 AM  Result Value Ref Range   Sodium 136 135 - 145 mmol/L   Potassium 3.4 (L) 3.5 - 5.1 mmol/L   Chloride 98 (L) 101 - 111 mmol/L   CO2 24 22 - 32 mmol/L   Glucose, Bld 70 65 - 99 mg/dL   BUN <5 (L) 6 - 20 mg/dL   Creatinine, Ser 0.80 0.61 - 1.24 mg/dL   Calcium 8.4 (L) 8.9 - 10.3 mg/dL   GFR calc non Af Amer >60 >60 mL/min   GFR calc Af Amer >60 >60 mL/min    Comment: (NOTE) The eGFR has been calculated using the CKD EPI equation. This calculation has not been validated in all clinical situations. eGFR's persistently <60 mL/min signify possible Chronic Kidney Disease.    Anion gap 14 5 - 15  Comprehensive metabolic panel      Status: Abnormal   Collection Time: 09/13/14  5:13 AM  Result Value Ref Range   Sodium 137 135 - 145 mmol/L   Potassium 3.8 3.5 - 5.1 mmol/L   Chloride 101 101 - 111 mmol/L   CO2 28 22 - 32 mmol/L   Glucose, Bld 99 65 - 99 mg/dL   BUN <5 (L) 6 - 20 mg/dL   Creatinine, Ser 0.67 0.61 - 1.24 mg/dL   Calcium 8.3 (L) 8.9 - 10.3 mg/dL   Total Protein 5.8 (L) 6.5 - 8.1 g/dL   Albumin 3.3 (L) 3.5 - 5.0 g/dL   AST 21  15 - 41 U/L   ALT 51 17 - 63 U/L   Alkaline Phosphatase 62 38 - 126 U/L   Total Bilirubin 0.9 0.3 - 1.2 mg/dL   GFR calc non Af Amer >60 >60 mL/min   GFR calc Af Amer >60 >60 mL/min    Comment: (NOTE) The eGFR has been calculated using the CKD EPI equation. This calculation has not been validated in all clinical situations. eGFR's persistently <60 mL/min signify possible Chronic Kidney Disease.    Anion gap 8 5 - 15  CBC     Status: Abnormal   Collection Time: 09/13/14  5:13 AM  Result Value Ref Range   WBC 4.4 4.0 - 10.5 K/uL   RBC 4.55 4.22 - 5.81 MIL/uL   Hemoglobin 13.2 13.0 - 17.0 g/dL   HCT 38.5 (L) 39.0 - 52.0 %   MCV 84.6 78.0 - 100.0 fL   MCH 29.0 26.0 - 34.0 pg   MCHC 34.3 30.0 - 36.0 g/dL   RDW 14.6 11.5 - 15.5 %   Platelets 165 150 - 400 K/uL     Lipid Panel  No results found for: CHOL, TRIG, HDL, CHOLHDL, VLDL, LDLCALC, LDLDIRECT   No results found for: HGBA1C   Lab Results  Component Value Date   CREATININE 0.67 09/13/2014     HPI :34 y.o. white male Who is transferred from [facility] after developing generalized abdominal cramping with profuse nausea and vomiting. He had mildly elevated liver function tests. He reportedly has a history of hepatitis C. He has noted small volume hematochezia with most of his stool since admission with no diarrhea. His nausea and vomiting of improved but denies abdominal pain persist. Abdominal CT scan did not show any obvious cause of his pain. The patient has a history of Nissen fundoplication and  possible stomach ulcer. He is also status post cholecystectomy.I do not know how where he was diagnosed with hepatitis C.   HOSPITAL COURSE:  Acute hepatitis/hepatitis C Presumed to be secondary to Gabapentin use. Has been improving off of this medication. Hepatitis C antibody is positive. Genotype is pending and quantitative RNA is 253000 international units per mL. Has an appointment at [facility] with a hepatologist. Patient is also to follow-up with Dr. Amedeo Plenty.  Nausea and vomiting No further hematemesis noted. CT of the abdomen, pelvis did not show any acute findings. His presentation could be due to opiate withdrawal. With the patient continues to have nausea and vomiting. GI has performed flexible sigmoidoscopy as well as upper endoscopy. Results as above. GI feels that the patient has intractable nausea secondary to opiate withdrawal. Patient has been advanced to regular diet which he is tolerating well. Continue PPI. Continue when necessary Phenergan , refill is being provided. He is status post cholecystectomy.   Severe hypokalemia Repleted  Rectal bleed/sigmoid polyp on sigmoidoscopy Initially noted to have rectal bleeding. Gastroenterology was consulted. Patient underwent flexible sigmoidoscopy, which revealed a sigmoid polyp. He will need colonoscopy eventually, but this could be done as an outpatient. No further episodes of bleeding.  Mild anemia, likely due to acute blood loss Secondary to rectal bleeding. Hemoglobin stable  History of chronic pain Patient was on opiate for many years. Recently, he decided to come off of it and hence was in [facility]. Patient requesting another refill of oxycodone, so that he can taper himself off slowly. Patient anticipates getting back into [facility]. This was discussed with the patient and his father.   Discharge Exam: Blood  pressure 144/82, pulse 56, temperature 98.1 F (36.7 C), temperature source Oral, resp. rate 15, height 6'  (1.829 m), weight 76.204 kg (168 lb), SpO2 100 %.  General appearance: alert, cooperative, appears stated age and no distress Resp: clear to auscultation bilaterally Cardio: regular rate and rhythm, S1, S2 normal, no murmur, click, rub or gallop GI: Soft. Mildly tender in the epigastric area without any rebound, rigidity or guarding. No masses or organomegaly. Bowel sounds are present. 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 Neurologic: No focal deficits       Discharge Instructions    Diet - low sodium heart healthy    Complete by:  As directed      Increase activity slowly    Complete by:  As directed            Follow-up Information    Follow up with HAYES,JOHN C, MD In 4 weeks.   Specialty:  Gastroenterology   Why:  To set up follow-up: To remove colon polyp   Contact information:   [address] [address] [phone]       Signed: Reyne Dumas 09/13/2014, 1:34 PM        Time spent >45 mins

## 2014-09-13 NOTE — Progress Notes (Signed)
Spoke with Janet,SW. Patient discharged home to mother's house. Patient wishes to resume services at [facility] where he was prior to being admitted. Per mother, [facility] requests discharge information be faxed to resume treatment on Monday. SW to fax info.

## 2014-09-15 ENCOUNTER — Telehealth: Payer: Self-pay | Admitting: Professional Counselor

## 2014-09-15 LAB — OPIATES,MS,WB/SP RFX: Opiate Confirmation: UNDETERMINED

## 2014-09-15 NOTE — Telephone Encounter (Signed)
LCSW received call from Katharine Look at [facility] requesting information on patient who is a current resident in facility. Reports she received information over the weekend regarding patient's medical status. Requesting DC summary and notes in effort to have patient return to [facility].  Patient already consented to information being sent. [facility] to call patient mother in effort to arrange other community needs.  LCSW faxed information as requested. Patient is currently DC.  No barriers or other needs at this time.  Lane Hacker, MSW Clinical Social Work: Emergency Room [phone]

## 2014-09-16 ENCOUNTER — Encounter (HOSPITAL_COMMUNITY): Payer: Self-pay | Admitting: Gastroenterology

## 2015-10-19 ENCOUNTER — Emergency Department (HOSPITAL_COMMUNITY)
Admission: EM | Admit: 2015-10-19 | Discharge: 2015-10-19 | Disposition: A | Discharge status: Home / Self Care | Payer: No Typology Code available for payment source | Attending: Emergency Medicine | Admitting: Emergency Medicine

## 2015-10-19 ENCOUNTER — Encounter (HOSPITAL_COMMUNITY): Payer: Self-pay | Admitting: Emergency Medicine

## 2015-10-19 ENCOUNTER — Emergency Department (HOSPITAL_COMMUNITY): Payer: No Typology Code available for payment source

## 2015-10-19 DIAGNOSIS — M545 Low back pain, unspecified: Secondary | ICD-10-CM

## 2015-10-19 DIAGNOSIS — Y92410 Unspecified street and highway as the place of occurrence of the external cause: Secondary | ICD-10-CM | POA: Insufficient documentation

## 2015-10-19 DIAGNOSIS — S7012XA Contusion of left thigh, initial encounter: Secondary | ICD-10-CM

## 2015-10-19 DIAGNOSIS — S79922A Unspecified injury of left thigh, initial encounter: Secondary | ICD-10-CM | POA: Diagnosis present

## 2015-10-19 DIAGNOSIS — Y999 Unspecified external cause status: Secondary | ICD-10-CM | POA: Diagnosis not present

## 2015-10-19 DIAGNOSIS — M542 Cervicalgia: Secondary | ICD-10-CM | POA: Insufficient documentation

## 2015-10-19 DIAGNOSIS — S8012XA Contusion of left lower leg, initial encounter: Secondary | ICD-10-CM | POA: Insufficient documentation

## 2015-10-19 DIAGNOSIS — T148XXA Other injury of unspecified body region, initial encounter: Secondary | ICD-10-CM

## 2015-10-19 DIAGNOSIS — F17210 Nicotine dependence, cigarettes, uncomplicated: Secondary | ICD-10-CM | POA: Diagnosis not present

## 2015-10-19 DIAGNOSIS — Y939 Activity, unspecified: Secondary | ICD-10-CM | POA: Diagnosis not present

## 2015-10-19 MED ORDER — NAPROXEN 500 MG PO TABS
500.0000 mg | ORAL_TABLET | Freq: Once | ORAL | Status: AC
  Administered 2015-10-19: 500 mg via ORAL
  Filled 2015-10-19: qty 1

## 2015-10-19 MED ORDER — CYCLOBENZAPRINE HCL 10 MG PO TABS: 10.0000 mg | ORAL_TABLET | Freq: Two times a day (BID) | ORAL | 0 refills | Status: DC | PRN

## 2015-10-19 MED ORDER — ACETAMINOPHEN 325 MG PO TABS
650.0000 mg | ORAL_TABLET | Freq: Once | ORAL | Status: AC
  Administered 2015-10-19: 650 mg via ORAL
  Filled 2015-10-19: qty 2

## 2015-10-19 MED ORDER — IBUPROFEN 800 MG PO TABS: 800.0000 mg | ORAL_TABLET | Freq: Three times a day (TID) | ORAL | 0 refills | Status: DC
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
## 2016-09-27 IMAGING — CT CT ABD-PELV W/ CM
2 of 4 series · 16 of 46 positions shown, 18 images · IV contrast (APPLIED)
Comparison: None.

CLINICAL DATA: Mid epigastric abdominal pain with fever, nausea,
and vomiting.

EXAM:
CT ABDOMEN AND PELVIS WITH CONTRAST
TECHNIQUE: Multidetector CT imaging of the abdomen and pelvis was performed
using the standard protocol following bolus administration of
intravenous contrast.
CONTRAST:  100mL OMNIPAQUE IOHEXOL 300 MG/ML  SOLN

[Series 2: abd/ pelvis 5.0 i30f 1 · axial · 0.86mm/px · z∈[+938,+1408]mm · 13 of 104 slices shown, 15 images]
[im 5/104  soft-tissue]
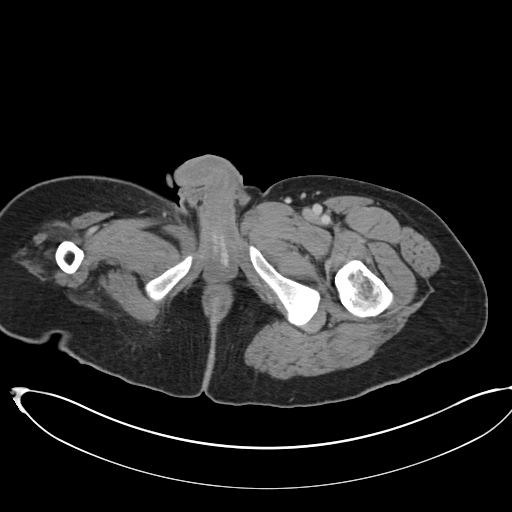
[im 5/104  bone]
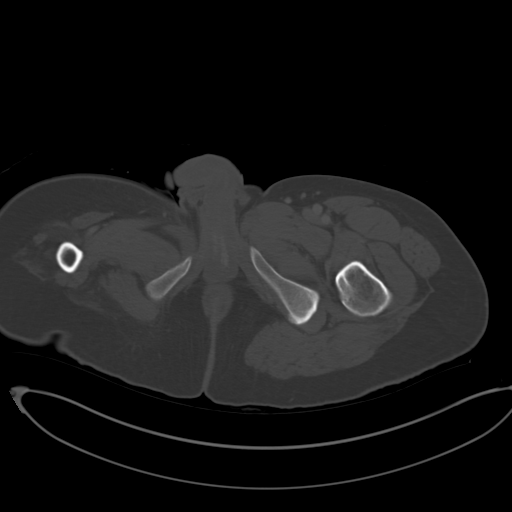
[im 13/104  soft-tissue]
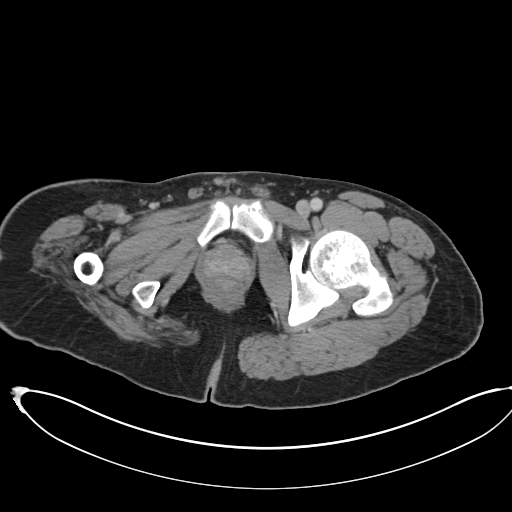
[im 21/104  soft-tissue]
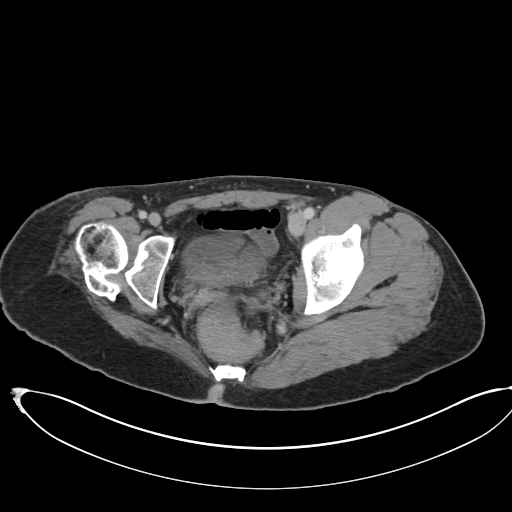
[im 29/104  soft-tissue]
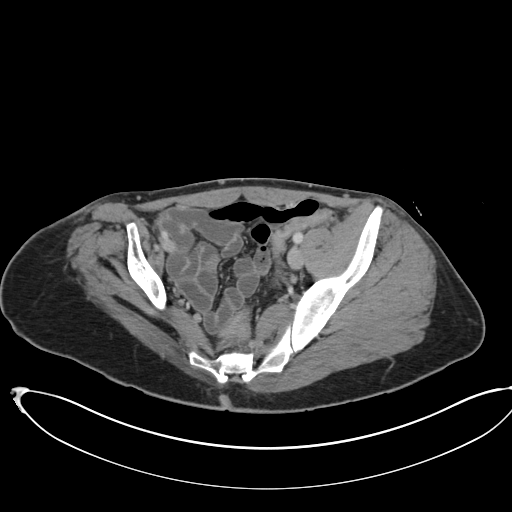
[im 38/104  soft-tissue]
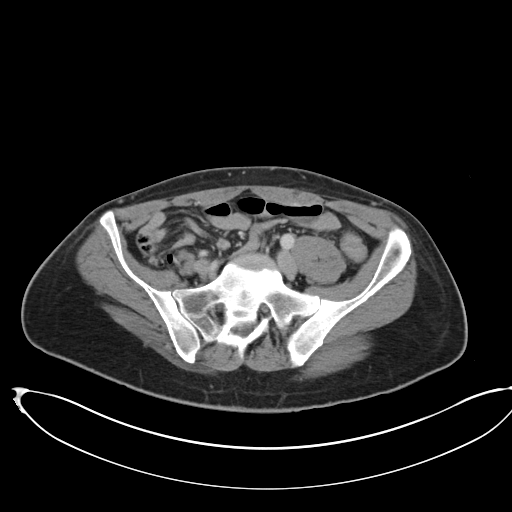
[im 46/104  soft-tissue]
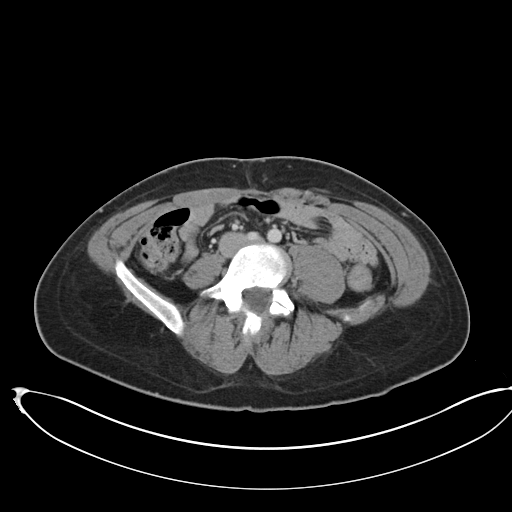
[im 54/104  soft-tissue]
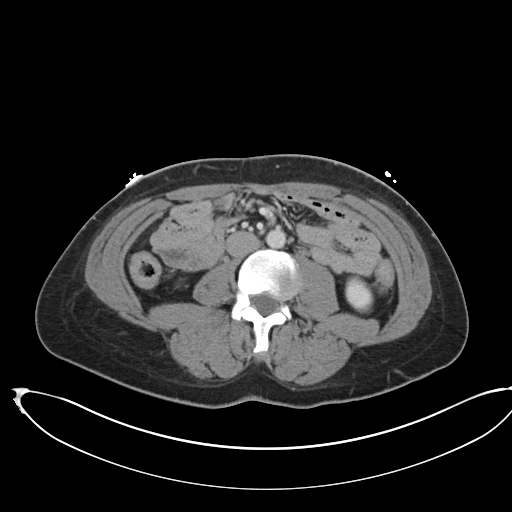
[im 58/104  soft-tissue]
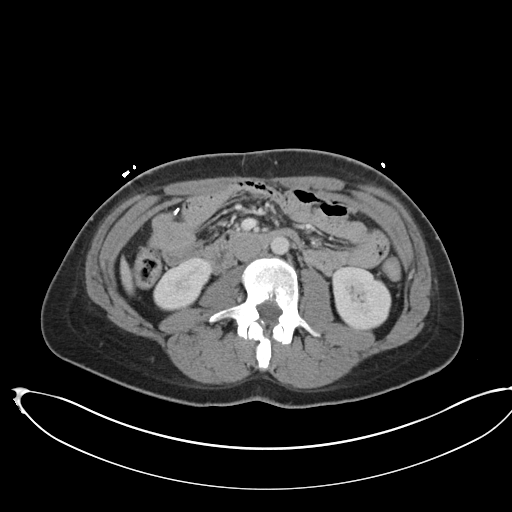
[im 66/104  soft-tissue]
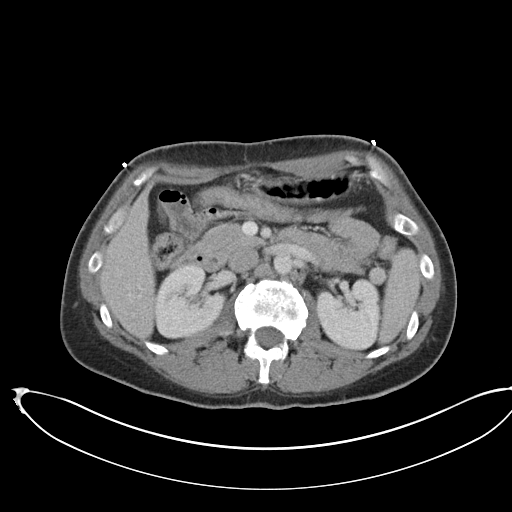
[im 66/104  bone]
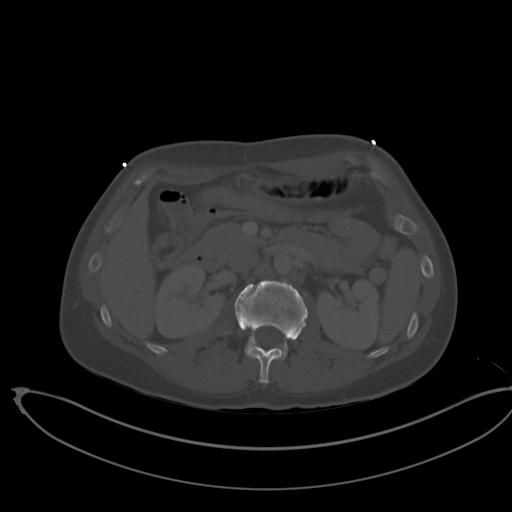
[im 75/104  soft-tissue]
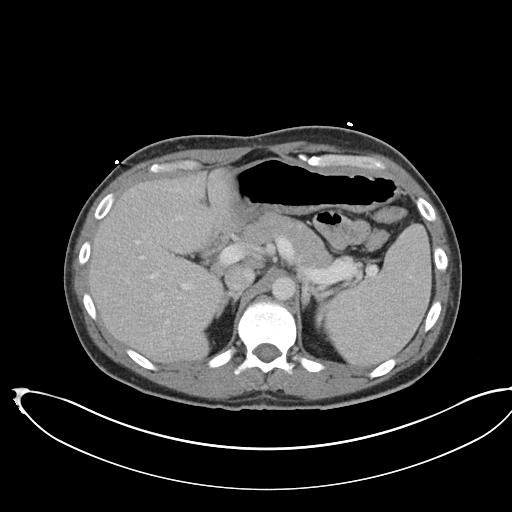
[im 83/104  soft-tissue]
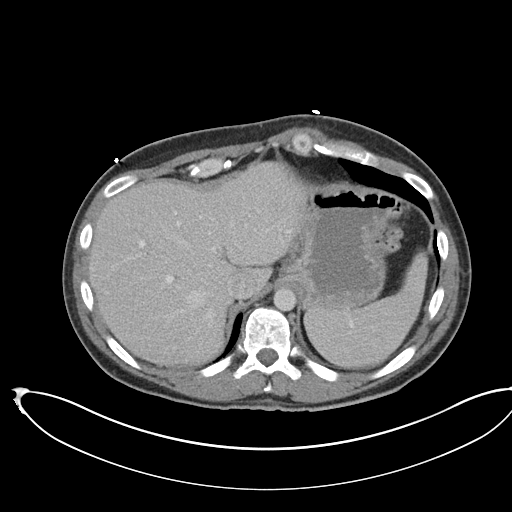
[im 91/104  soft-tissue]
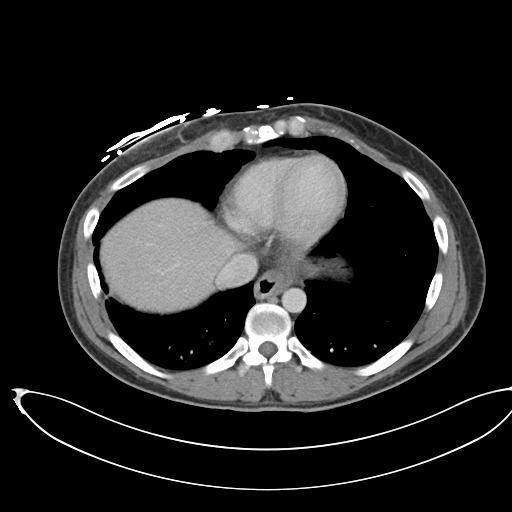
[im 99/104  soft-tissue]
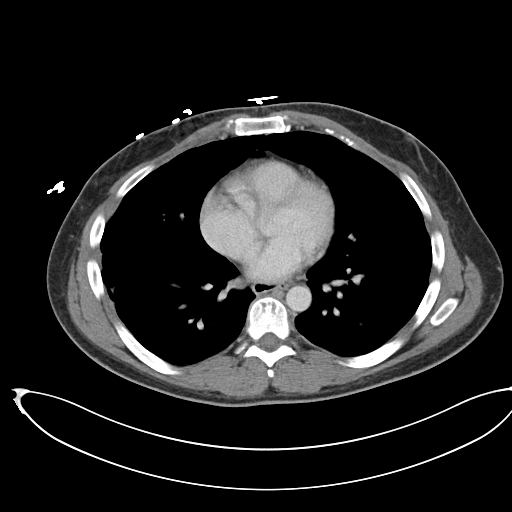

[Series 5: coronal soft tissue · coronal · 0.89mm/px · 3 of 94 slices shown]
[im 32/94  soft-tissue]
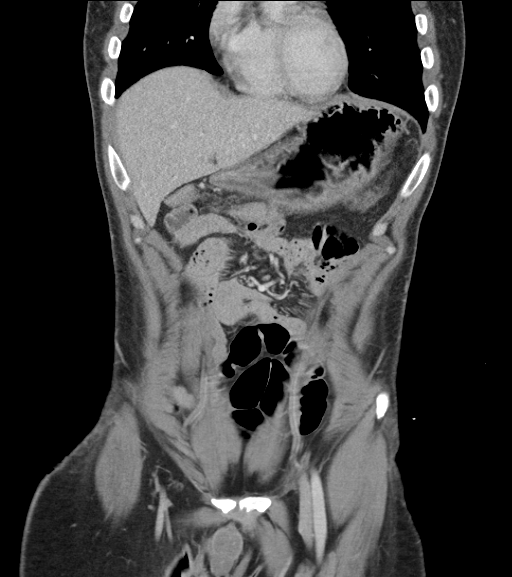
[im 42/94  soft-tissue]
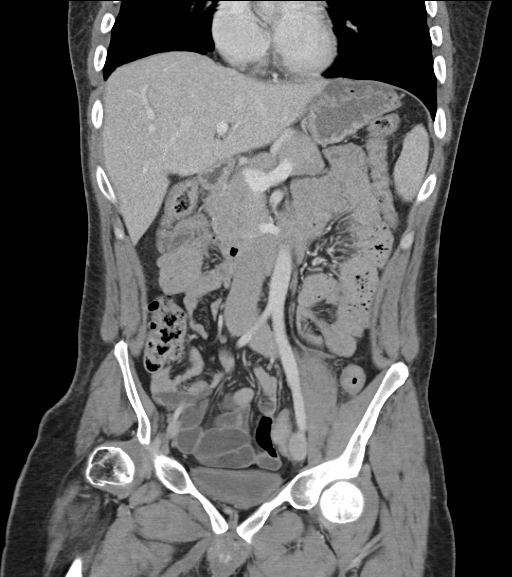
[im 52/94  soft-tissue]
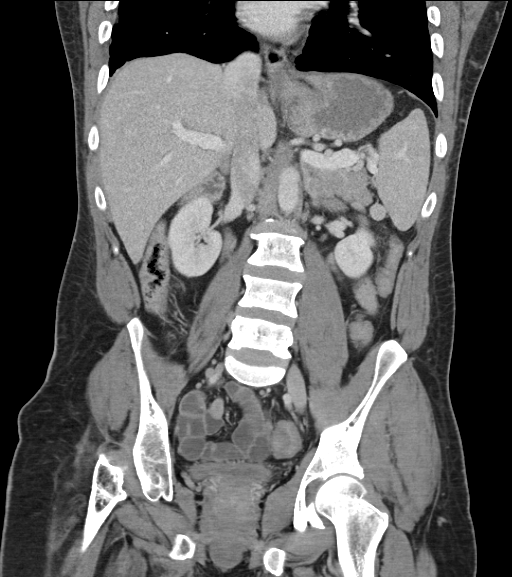

[16 of 46 positions shown; findings below may reference images not displayed]

FINDINGS: BODY WALL: Prior upper midline laparotomy.

LOWER CHEST: Subpleural scarring in the right lower lobe, mild.

ABDOMEN/PELVIS:

Liver: No focal abnormality.

Biliary: Cholecystectomy.

Pancreas: Unremarkable.

Spleen: Unremarkable.

Adrenals: Unremarkable.

Kidneys and ureters: Punctate nonobstructive stones in the bilateral
kidneys. No ureteral calculus or hydronephrosis.

Bladder: Unremarkable.

Reproductive: No pathologic findings.

Bowel: No definite bowel wall thickening when accounting for under
distention. No bowel obstruction. No appendicitis.

Retroperitoneum: Enlarged lymph nodes in the deep liver drainage,
usually incidental in isolation.

Peritoneum: No ascites or pneumoperitoneum.

Vascular: Diminutive right lower extremity arterial and venous
system in the setting of marked atrophy.

OSSEOUS: Developmental dysplasia of the right hip with essentially
absent right acetabulum and chronic femoral head dislocation and a
sphericity.

Left L5 pars defect without slip.  L1-L2 interbody ankylosis.
IMPRESSION: 1. No acute finding, including appendicitis.
2. Developmental dysplasia of the right hip with chronic
dislocation.
3. Cholecystectomy.
4. Tiny bilateral renal calculi.

## 2016-11-15 ENCOUNTER — Emergency Department
Admission: EM | Admit: 2016-11-15 | Discharge: 2016-11-15 | Disposition: A | Discharge status: Home / Self Care | Payer: Medicaid Other | Attending: Emergency Medicine | Admitting: Emergency Medicine

## 2016-11-15 ENCOUNTER — Emergency Department: Payer: Medicaid Other

## 2016-11-15 DIAGNOSIS — F17210 Nicotine dependence, cigarettes, uncomplicated: Secondary | ICD-10-CM | POA: Insufficient documentation

## 2016-11-15 DIAGNOSIS — R2241 Localized swelling, mass and lump, right lower limb: Secondary | ICD-10-CM | POA: Diagnosis present

## 2016-11-15 DIAGNOSIS — Z79899 Other long term (current) drug therapy: Secondary | ICD-10-CM | POA: Diagnosis not present

## 2016-11-15 DIAGNOSIS — L02415 Cutaneous abscess of right lower limb: Secondary | ICD-10-CM | POA: Insufficient documentation

## 2016-11-15 DIAGNOSIS — L0291 Cutaneous abscess, unspecified: Secondary | ICD-10-CM

## 2016-11-15 LAB — COMPREHENSIVE METABOLIC PANEL
ALK PHOS: 103 U/L (ref 38–126)
ALT: 12 U/L — ABNORMAL LOW (ref 17–63)
AST: 17 U/L (ref 15–41)
Albumin: 4.3 g/dL (ref 3.5–5.0)
Anion gap: 11 (ref 5–15)
BILIRUBIN TOTAL: 0.6 mg/dL (ref 0.3–1.2)
BUN: 10 mg/dL (ref 6–20)
CALCIUM: 9.3 mg/dL (ref 8.9–10.3)
CO2: 28 mmol/L (ref 22–32)
CREATININE: 1.05 mg/dL (ref 0.61–1.24)
Chloride: 102 mmol/L (ref 101–111)
Glucose, Bld: 94 mg/dL (ref 65–99)
Potassium: 3.9 mmol/L (ref 3.5–5.1)
Sodium: 141 mmol/L (ref 135–145)
TOTAL PROTEIN: 8 g/dL (ref 6.5–8.1)

## 2016-11-15 LAB — CBC WITH DIFFERENTIAL/PLATELET
Basophils Absolute: 0 10*3/uL (ref 0–0.1)
Basophils Relative: 0 %
Eosinophils Absolute: 0.1 10*3/uL (ref 0–0.7)
Eosinophils Relative: 1 %
HCT: 40.2 % (ref 40.0–52.0)
HEMOGLOBIN: 13.2 g/dL (ref 13.0–18.0)
Lymphocytes Relative: 18 %
Lymphs Abs: 1.5 10*3/uL (ref 1.0–3.6)
MCH: 26.4 pg (ref 26.0–34.0)
MCHC: 32.8 g/dL (ref 32.0–36.0)
MCV: 80.4 fL (ref 80.0–100.0)
Monocytes Absolute: 0.5 10*3/uL (ref 0.2–1.0)
Monocytes Relative: 6 %
NEUTROS PCT: 75 %
Neutro Abs: 6.4 10*3/uL (ref 1.4–6.5)
PLATELETS: 277 10*3/uL (ref 150–440)
RBC: 5 MIL/uL (ref 4.40–5.90)
RDW: 17.2 % — ABNORMAL HIGH (ref 11.5–14.5)
WBC: 8.5 10*3/uL (ref 3.8–10.6)

## 2016-11-15 LAB — URINALYSIS, COMPLETE (UACMP) WITH MICROSCOPIC
Bacteria, UA: NONE SEEN
Bilirubin Urine: NEGATIVE
GLUCOSE, UA: NEGATIVE mg/dL
Hgb urine dipstick: NEGATIVE
KETONES UR: NEGATIVE mg/dL
Leukocytes, UA: NEGATIVE
Nitrite: NEGATIVE
PROTEIN: 30 mg/dL — AB
SQUAMOUS EPITHELIAL / LPF: NONE SEEN
Specific Gravity, Urine: 1.018 (ref 1.005–1.030)
pH: 5 (ref 5.0–8.0)

## 2016-11-15 LAB — LACTIC ACID, PLASMA: LACTIC ACID, VENOUS: 1.7 mmol/L (ref 0.5–1.9)

## 2016-11-15 MED ORDER — SULFAMETHOXAZOLE-TRIMETHOPRIM 800-160 MG PO TABS: 1.0000 | ORAL_TABLET | Freq: Two times a day (BID) | ORAL | 0 refills | Status: AC

## 2016-11-15 MED ORDER — LIDOCAINE HCL (PF) 1 % IJ SOLN
5.0000 mL | Freq: Once | INTRAMUSCULAR | Status: AC
  Administered 2016-11-15: 5 mL via INTRADERMAL
  Filled 2016-11-15 (×2): qty 5

## 2016-11-15 NOTE — ED Provider Notes (Signed)
[facility] Emergency Department Provider Note   ____________________________________________   I have reviewed the triage vital signs and the nursing notes.   HISTORY  Chief Complaint Leg Pain   History limited by: Not Limited   HPI Cameron Morton is a 36 y.o. male who presents to the emergency department today because of concern for leg infection   LOCATION:left thigh DURATION:4-5 days TIMING: constant since it started SEVERITY: severe QUALITY: painful, redness CONTEXT: patient states that he has had mrsa infections in the past. Had a couple of pimples that he popped before the bad infection started.  MODIFYING FACTORS: none identified ASSOCIATED SYMPTOMS: denies any measured fevers although has had chills, no chest pain no shortness of breath. Has chronic right upper abdominal pain.  Per medical record review patient has a history of abdominal pain.  Past Medical History:  Diagnosis Date  . Hepatitis C   . Stomach ulcer 2012    Patient Active Problem List   Diagnosis Date Noted  . Acute hepatitis 09/06/2014  . 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 Hepatitis C 09/06/2014  . GERD (gastroesophageal reflux disease) 09/06/2014  . Abdominal pain, right upper quadrant 09/06/2014  . Vomiting 09/06/2014  . GI bleed 09/06/2014    Past Surgical History:  Procedure Laterality Date  . ESOPHAGOGASTRODUODENOSCOPY N/A 09/12/2014   Procedure: ESOPHAGOGASTRODUODENOSCOPY (EGD);  Surgeon: Teena Irani, MD;  Location: [facility] ENDOSCOPY;  Service: Endoscopy;  Laterality: N/A;  . FLEXIBLE SIGMOIDOSCOPY N/A 09/10/2014   Procedure: FLEXIBLE SIGMOIDOSCOPY;  Surgeon: Teena Irani, MD;  Location: [facility] ENDOSCOPY;  Service: Endoscopy;  Laterality: N/A;    Prior to Admission medications   Medication Sig Start Date End Date Taking? Authorizing Provider  acetaminophen (TYLENOL) 500 MG tablet Take 1,000 mg by mouth every 6 (six) hours as needed for  mild pain.    [provider]  alum & mag hydroxide-simeth (MAALOX/MYLANTA) 200-200-20 MG/5ML suspension Take 30 mLs by mouth every 6 (six) hours as needed for indigestion or heartburn.    [provider]  cyclobenzaprine (FLEXERIL) 10 MG tablet Take 1 tablet (10 mg total) by mouth 2 (two) times daily as needed for muscle spasms. 10/19/15   Gloriann Loan, PA-C  ibuprofen (ADVIL,MOTRIN) 800 MG tablet Take 1 tablet (800 mg total) by mouth 3 (three) times daily. 10/19/15   Gloriann Loan, PA-C  methocarbamol (ROBAXIN) 500 MG tablet Take 1,000 mg by mouth 3 (three) times daily as needed for muscle spasms.    [provider]  Multiple Vitamin (MULTIVITAMIN WITH MINERALS) TABS tablet Take 1 tablet by mouth daily.    [provider]  oxyCODONE (OXY IR/ROXICODONE) 5 MG immediate release tablet Take 1 tablet (5 mg total) by mouth every 8 (eight) hours as needed for moderate pain or breakthrough pain. 09/13/14   Reyne Dumas, MD  pantoprazole (PROTONIX) 40 MG tablet Take 1 tablet (40 mg total) by mouth 2 (two) times daily. 09/13/14   Reyne Dumas, MD  promethazine (PHENERGAN) 12.5 MG tablet Take 1 tablet (12.5 mg total) by mouth every 6 (six) hours as needed for nausea or vomiting. 09/13/14   Reyne Dumas, MD    Allergies Patient has no known allergies.  No family history on file.  Social History Social History  Substance Use Topics  . Smoking status: Current Every Day Smoker    Packs/day: 0.50    Types: Cigarettes  . Smokeless tobacco: Not on file  . Alcohol use No    Review of  Systems Constitutional: Positive for chills. Eyes: No visual changes. ENT: No sore throat. Cardiovascular: Denies chest pain. Respiratory: Denies shortness of breath. Gastrointestinal: Positive for chronic abdominal pain.  No nausea, no vomiting.  No diarrhea.   Genitourinary: Negative for dysuria. Musculoskeletal: Negative for back pain. Skin: Positive for redness pain to right  thigh Neurological: Negative for headaches, focal weakness or numbness.  ____________________________________________   PHYSICAL EXAM:  VITAL SIGNS: ED Triage Vitals  Enc Vitals Group     BP 11/15/16 1646 138/69     Pulse Rate 11/15/16 1646 (!) 116     Resp 11/15/16 1646 18     Temp 11/15/16 1646 98.6 F (37 C)     Temp Source 11/15/16 1646 Oral     SpO2 11/15/16 1646 99 %     Weight 11/15/16 1647 215 lb (97.5 kg)     Height 11/15/16 1647 6' (1.829 m)     Head Circumference --      Peak Flow --      Pain Score 11/15/16 1650 10    Constitutional: Alert and oriented. Well appearing and in no distress. Eyes: Conjunctivae are normal.  ENT   Head: Normocephalic and atraumatic.   Nose: No congestion/rhinnorhea.   Mouth/Throat: Mucous membranes are moist.   Neck: No stridor. Hematological/Lymphatic/Immunilogical: No cervical lymphadenopathy. Cardiovascular: Normal rate, regular rhythm.  No murmurs, rubs, or gallops. Respiratory: Normal respiratory effort without tachypnea nor retractions. Breath sounds are clear and equal bilaterally. No wheezes/rales/rhonchi. Gastrointestinal: Soft and non tender. No rebound. No guarding.  Genitourinary: Deferred Musculoskeletal: s/p right bka Neurologic:  Normal speech and language. No gross focal neurologic deficits are appreciated.  Skin:  Area of erythema, fluctuance to right thigh. Psychiatric: Mood and affect are normal. Speech and behavior are normal. Patient exhibits appropriate insight and judgment.  ____________________________________________    LABS (pertinent positives/negatives)  Lactic 1.7 CMP wnl except alt 12 CBC wnl except rdw 17.2 UA not consistent with infection  ____________________________________________   EKG  None  ____________________________________________     RADIOLOGY  CXR Atelectasis/infiltrate   ____________________________________________   PROCEDURES  Procedures  Incision and Drainage of Abcess Location: right thigh Anesthesia Local: 1% Lidocaine  Prep/Procedure: Skin Prep: Chlorahex Incised abscess with #11 blade Purulent discharge: large Probed to break up loculations Packed with 1/4" gauze Estimated blood loss: 2 ml  ____________________________________________   INITIAL IMPRESSION / ASSESSMENT AND PLAN / ED COURSE  Pertinent labs & imaging results that were available during my care of the patient were reviewed by me and considered in my medical decision making (see chart for details).  Patient presented to the emergency department today because of concerns for redness and pain to his right thigh.  Differential includes cellulitis, parasitic infection, abscess amongst other etiologies.  Blood work does not suggest sepsis at this time.  The lesion was ultrasounded which did show some pus underneath it.  Patient underwent an I&D with a large amount of pus.  Will plan on discharge.  Discussed care with patient. ________________________________________   FINAL CLINICAL IMPRESSION(S) / ED DIAGNOSES  Final diagnoses:  Abscess     Note: This dictation was prepared with Dragon dictation. Any transcriptional errors that result from this process are unintentional     Nance Pear, MD 11/15/16 2130

## 2016-11-15 NOTE — ED Triage Notes (Signed)
Pt from home via pov with cellulitis to right leg. Pt has below knee amputation (36 years old); this is 6 inches above amputation. Pt has hx of MRSA; states this began as a "bump" that he "popped." After 2 more of those, he developed this one over the weekend. Area is red with yellow areas, hot, very painful. Pt alert & oriented with NAD noted.

## 2016-11-15 NOTE — ED Notes (Signed)
Pt states he still has pain and lidocaine did not numb his wound. I offerred pain medicine to pt. Dr Archie Balboa gave verbal for 800mg  IBU. Pt declined.

## 2016-11-15 NOTE — ED Notes (Signed)
Pt hx of above the knee amputation to right leg. Small area of red, bump like over last few days. Popped and now has spread into larger area of reddened, cellulitis appearing area. Pt c/o pain to that area. Hot and cold flashes. Pt alert and oriented X4, active, cooperative, pt in NAD. RR even and unlabored, color WNL.

## 2016-11-15 NOTE — ED Notes (Signed)
Family at bedside. 

## 2016-11-15 NOTE — Discharge Instructions (Signed)
Please seek medical attention for any high fevers, chest pain, shortness of breath, change in behavior, persistent vomiting, bloody stool or any other new or concerning symptoms.  

## 2017-06-30 ENCOUNTER — Emergency Department
Admission: EM | Admit: 2017-06-30 | Discharge: 2017-06-30 | Disposition: A | Discharge status: Home / Self Care | Payer: Medicaid Other | Attending: Emergency Medicine | Admitting: Emergency Medicine

## 2017-06-30 ENCOUNTER — Other Ambulatory Visit: Payer: Self-pay

## 2017-06-30 ENCOUNTER — Encounter: Payer: Self-pay | Admitting: *Deleted

## 2017-06-30 ENCOUNTER — Emergency Department: Payer: Medicaid Other

## 2017-06-30 DIAGNOSIS — F17210 Nicotine dependence, cigarettes, uncomplicated: Secondary | ICD-10-CM | POA: Insufficient documentation

## 2017-06-30 DIAGNOSIS — L03116 Cellulitis of left lower limb: Secondary | ICD-10-CM | POA: Diagnosis not present

## 2017-06-30 DIAGNOSIS — M79672 Pain in left foot: Secondary | ICD-10-CM | POA: Diagnosis present

## 2017-06-30 DIAGNOSIS — Z79899 Other long term (current) drug therapy: Secondary | ICD-10-CM | POA: Diagnosis not present

## 2017-06-30 DIAGNOSIS — L0390 Cellulitis, unspecified: Secondary | ICD-10-CM

## 2017-06-30 HISTORY — DX: Carrier or suspected carrier of methicillin resistant Staphylococcus aureus: Z22.322

## 2017-06-30 LAB — CBC WITH DIFFERENTIAL/PLATELET
Basophils Absolute: 0.1 10*3/uL (ref 0–0.1)
Basophils Relative: 1 %
EOS PCT: 2 %
Eosinophils Absolute: 0.2 10*3/uL (ref 0–0.7)
HEMATOCRIT: 41.7 % (ref 40.0–52.0)
Hemoglobin: 14.2 g/dL (ref 13.0–18.0)
LYMPHS PCT: 27 %
Lymphs Abs: 2.9 10*3/uL (ref 1.0–3.6)
MCH: 28 pg (ref 26.0–34.0)
MCHC: 34 g/dL (ref 32.0–36.0)
MCV: 82.4 fL (ref 80.0–100.0)
MONO ABS: 0.7 10*3/uL (ref 0.2–1.0)
MONOS PCT: 7 %
NEUTROS ABS: 7 10*3/uL — AB (ref 1.4–6.5)
Neutrophils Relative %: 63 %
Platelets: 182 10*3/uL (ref 150–440)
RBC: 5.06 MIL/uL (ref 4.40–5.90)
RDW: 16.9 % — AB (ref 11.5–14.5)
WBC: 10.9 10*3/uL — ABNORMAL HIGH (ref 3.8–10.6)

## 2017-06-30 LAB — BASIC METABOLIC PANEL
Anion gap: 9 (ref 5–15)
BUN: 10 mg/dL (ref 6–20)
CALCIUM: 8.7 mg/dL — AB (ref 8.9–10.3)
CO2: 26 mmol/L (ref 22–32)
CREATININE: 0.72 mg/dL (ref 0.61–1.24)
Chloride: 103 mmol/L (ref 101–111)
GFR calc Af Amer: >60 mL/min (ref >60)
GFR calc non Af Amer: >60 mL/min (ref >60)
GLUCOSE: 78 mg/dL (ref 65–99)
Potassium: 4.5 mmol/L (ref 3.5–5.1)
Sodium: 138 mmol/L (ref 135–145)

## 2017-06-30 MED ORDER — CLINDAMYCIN HCL 150 MG PO CAPS: 300.0000 mg | ORAL_CAPSULE | Freq: Three times a day (TID) | ORAL | 0 refills | Status: DC

## 2017-06-30 MED ORDER — CLINDAMYCIN PHOSPHATE 600 MG/50ML IV SOLN
600.0000 mg | Freq: Once | INTRAVENOUS | Status: AC
  Administered 2017-06-30: 600 mg via INTRAVENOUS
  Filled 2017-06-30: qty 50

## 2017-06-30 NOTE — ED Provider Notes (Signed)
[facility] Emergency Department Provider Note  ____________________________________________   First MD Initiated Contact with Patient 06/30/17 1948     (approximate)  I have reviewed the triage vital signs and the nursing notes.   HISTORY  Chief Complaint Foot Pain    HPI Cameron Morton is a 37 y.o. male presents emergency department complaining of redness to the base of his left foot.  States he popped a pimple at the base of the foot and the area has become more red and swollen.  He denies fever or chills.  He has a history of MRSA and hep C.  Past Medical History:  Diagnosis Date  . Hepatitis C   . MRSA (methicillin resistant staph aureus) culture positive   . Stomach ulcer 2012    Patient Active Problem List   Diagnosis Date Noted  . Acute hepatitis 09/06/2014  . 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 Hepatitis C 09/06/2014  . GERD (gastroesophageal reflux disease) 09/06/2014  . Abdominal pain, right upper quadrant 09/06/2014  . Vomiting 09/06/2014  . GI bleed 09/06/2014    Past Surgical History:  Procedure Laterality Date  . BELOW KNEE LEG AMPUTATION    . ESOPHAGOGASTRODUODENOSCOPY N/A 09/12/2014   Procedure: ESOPHAGOGASTRODUODENOSCOPY (EGD);  Surgeon: Teena Irani, MD;  Location: [facility] ENDOSCOPY;  Service: Endoscopy;  Laterality: N/A;  . FLEXIBLE SIGMOIDOSCOPY N/A 09/10/2014   Procedure: FLEXIBLE SIGMOIDOSCOPY;  Surgeon: Teena Irani, MD;  Location: [facility] ENDOSCOPY;  Service: Endoscopy;  Laterality: N/A;    Prior to Admission medications   Medication Sig Start Date End Date Taking? Authorizing Provider  acetaminophen (TYLENOL) 500 MG tablet Take 1,000 mg by mouth every 6 (six) hours as needed for mild pain.    [provider]  alum & mag hydroxide-simeth (MAALOX/MYLANTA) 200-200-20 MG/5ML suspension Take 30 mLs by mouth every 6 (six) hours as needed for indigestion or heartburn.    [provider]    clindamycin (CLEOCIN) 150 MG capsule Take 2 capsules (300 mg total) by mouth 3 (three) times daily. 06/30/17   Fisher, Linden Dolin, PA-C  cyclobenzaprine (FLEXERIL) 10 MG tablet Take 1 tablet (10 mg total) by mouth 2 (two) times daily as needed for muscle spasms. 10/19/15   Gloriann Loan, PA-C  ibuprofen (ADVIL,MOTRIN) 800 MG tablet Take 1 tablet (800 mg total) by mouth 3 (three) times daily. 10/19/15   Gloriann Loan, PA-C  methocarbamol (ROBAXIN) 500 MG tablet Take 1,000 mg by mouth 3 (three) times daily as needed for muscle spasms.    [provider]  Multiple Vitamin (MULTIVITAMIN WITH MINERALS) TABS tablet Take 1 tablet by mouth daily.    [provider]  oxyCODONE (OXY IR/ROXICODONE) 5 MG immediate release tablet Take 1 tablet (5 mg total) by mouth every 8 (eight) hours as needed for moderate pain or breakthrough pain. 09/13/14   Reyne Dumas, MD  pantoprazole (PROTONIX) 40 MG tablet Take 1 tablet (40 mg total) by mouth 2 (two) times daily. 09/13/14   Reyne Dumas, MD  promethazine (PHENERGAN) 12.5 MG tablet Take 1 tablet (12.5 mg total) by mouth every 6 (six) hours as needed for nausea or vomiting. 09/13/14   Reyne Dumas, MD    Allergies Patient has no known allergies.  No family history on file.  Social History Social History   Tobacco Use  . Smoking status: Current Every Day Smoker    Packs/day: 0.50    Types: Cigarettes  Substance Use Topics  . Alcohol use: No  .  Drug use: No    Review of Systems  Constitutional: No fever/chills Eyes: No visual changes. ENT: No sore throat. Respiratory: Denies cough Genitourinary: Negative for dysuria. Musculoskeletal: Negative for back pain. Skin: Negative for rash.  Positive for redness and swelling to the left    ____________________________________________   PHYSICAL EXAM:  VITAL SIGNS: ED Triage Vitals  Enc Vitals Group     BP 06/30/17 1918 (!) 143/75     Pulse Rate 06/30/17 1918 81     Resp 06/30/17 1918 20      Temp 06/30/17 1918 98 F (36.7 C)     Temp Source 06/30/17 1918 Oral     SpO2 06/30/17 1918 98 %     Weight 06/30/17 1917 220 lb (99.8 kg)     Height 06/30/17 1917 6' (1.829 m)     Head Circumference --      Peak Flow --      Pain Score 06/30/17 1916 10     Pain Loc --      Pain Edu? --      Excl. in [place]? --     Constitutional: Alert and oriented. Well appearing and in no acute distress. Eyes: Conjunctivae are normal.  Head: Atraumatic. Nose: No congestion/rhinnorhea. Mouth/Throat: Mucous membranes are moist.   Cardiovascular: Normal rate, regular rhythm. Respiratory: Normal respiratory effort.  No retractions GU: deferred Musculoskeletal: FROM all extremities, warm and well perfused.  The left foot is tender.  The patient does not have a right lower extremity. Neurologic:  Normal speech and language.  Skin:  Skin is warm, dry, positive for redness and swelling to the left foot Psychiatric: Mood and affect are normal. Speech and behavior are normal.  ____________________________________________   LABS (all labs ordered are listed, but only abnormal results are displayed)  Labs Reviewed  CBC WITH DIFFERENTIAL/PLATELET - Abnormal; Notable for the following components:      Result Value   WBC 10.9 (*)    RDW 16.9 (*)    Neutro Abs 7.0 (*)    All other components within normal limits  BASIC METABOLIC PANEL - Abnormal; Notable for the following components:   Calcium 8.7 (*)    All other components within normal limits   ____________________________________________   ____________________________________________  RADIOLOGY  X-ray of the left foot shows no osteomyelitis  ____________________________________________   PROCEDURES  Procedure(s) performed: Clindamycin 600 mg IV  Procedures    ____________________________________________   INITIAL IMPRESSION / ASSESSMENT AND PLAN / ED COURSE  Pertinent labs & imaging results that were available during my care  of the patient were reviewed by me and considered in my medical decision making (see chart for details).  Patient is 37 year old male presents emergency department complaining of left foot redness and pain.  He states he popped a pimple and feels like the area has gotten worse.  On physical exam patient appears well.  He has redness and swelling to the left foot.  The patient does not have a right lower extremity.  He is afebrile at this time.  Remainder the exam is unremarkable  Clindamycin 600 mg IV.  CBC shows elevated white blood cell count.  Metabolic panel is normal.  X-ray of the left foot is negative for osteomyelitis  Explained all of the labs and x-ray findings to the patient.  He was given a prescription for clindamycin to start tomorrow.  He is to soak the foot in warm water with Epson salts.  He is to return to emergency  department if worsening.  He states he understands will comply with instructions.  Was discharged in stable condition     As part of my medical decision making, I reviewed the following data within the [place] notes reviewed and incorporated, Labs reviewed CBC with elevated white blood cell count, normal metabolic panel, Old chart reviewed, Radiograph reviewed x-ray of the left foot shows no osteomyelitis, Notes from prior ED visits and [place] Controlled Substance Database  ____________________________________________   FINAL CLINICAL IMPRESSION(S) / ED DIAGNOSES  Final diagnoses:  Cellulitis, unspecified cellulitis site      NEW MEDICATIONS STARTED DURING THIS VISIT:  Discharge Medication List as of 06/30/2017 10:32 PM    START taking these medications   Details  clindamycin (CLEOCIN) 150 MG capsule Take 2 capsules (300 mg total) by mouth 3 (three) times daily., Starting Fri 06/30/2017, Print         Note:  This document was prepared using Dragon voice recognition software and may include unintentional dictation errors.     Versie Starks, PA-C 06/30/17 2257    Schaevitz, Randall An, MD 07/01/17 Shelah Lewandowsky

## 2017-06-30 NOTE — ED Triage Notes (Signed)
Pt says about three days ago he noticed what looked like a pimple area on the base of his left foot 2nd and 3rd toe he drained. Reports area has become more sore and red over the past days, denies fevers, c/o soreness in his foot. Denies itching, has localized redness without streaking and some swelling to the area.

## 2017-06-30 NOTE — Discharge Instructions (Addendum)
All up with your regular doctor if not better in 3 to 5 days.  If you are worsening please return emergently department.  Take medication as prescribed

## 2017-08-08 ENCOUNTER — Emergency Department: Payer: Medicaid Other

## 2017-08-08 ENCOUNTER — Inpatient Hospital Stay
Admission: EM | Admit: 2017-08-08 | Discharge: 2017-08-11 | DRG: 439 | Disposition: A | Discharge status: Home / Self Care | Payer: Medicaid Other | Attending: Family Medicine | Admitting: Family Medicine

## 2017-08-08 ENCOUNTER — Other Ambulatory Visit: Payer: Self-pay

## 2017-08-08 ENCOUNTER — Encounter: Payer: Self-pay | Admitting: Emergency Medicine

## 2017-08-08 DIAGNOSIS — R1013 Epigastric pain: Secondary | ICD-10-CM | POA: Diagnosis present

## 2017-08-08 DIAGNOSIS — K3189 Other diseases of stomach and duodenum: Secondary | ICD-10-CM | POA: Diagnosis present

## 2017-08-08 DIAGNOSIS — B1920 Unspecified viral hepatitis C without hepatic coma: Secondary | ICD-10-CM | POA: Diagnosis present

## 2017-08-08 DIAGNOSIS — Z89511 Acquired absence of right leg below knee: Secondary | ICD-10-CM | POA: Diagnosis not present

## 2017-08-08 DIAGNOSIS — Z8711 Personal history of peptic ulcer disease: Secondary | ICD-10-CM

## 2017-08-08 DIAGNOSIS — Z8614 Personal history of Methicillin resistant Staphylococcus aureus infection: Secondary | ICD-10-CM

## 2017-08-08 DIAGNOSIS — F17210 Nicotine dependence, cigarettes, uncomplicated: Secondary | ICD-10-CM | POA: Diagnosis present

## 2017-08-08 DIAGNOSIS — K859 Acute pancreatitis without necrosis or infection, unspecified: Secondary | ICD-10-CM | POA: Diagnosis present

## 2017-08-08 DIAGNOSIS — K449 Diaphragmatic hernia without obstruction or gangrene: Secondary | ICD-10-CM | POA: Diagnosis present

## 2017-08-08 DIAGNOSIS — Z79891 Long term (current) use of opiate analgesic: Secondary | ICD-10-CM | POA: Diagnosis not present

## 2017-08-08 DIAGNOSIS — K210 Gastro-esophageal reflux disease with esophagitis: Secondary | ICD-10-CM | POA: Diagnosis present

## 2017-08-08 DIAGNOSIS — K921 Melena: Secondary | ICD-10-CM | POA: Diagnosis present

## 2017-08-08 DIAGNOSIS — R109 Unspecified abdominal pain: Secondary | ICD-10-CM | POA: Diagnosis present

## 2017-08-08 LAB — CBC
HCT: 40.5 % (ref 40.0–52.0)
Hemoglobin: 14 g/dL (ref 13.0–18.0)
MCH: 28 pg (ref 26.0–34.0)
MCHC: 34.7 g/dL (ref 32.0–36.0)
MCV: 80.6 fL (ref 80.0–100.0)
PLATELETS: 317 10*3/uL (ref 150–440)
RBC: 5.02 MIL/uL (ref 4.40–5.90)
RDW: 16.1 % — AB (ref 11.5–14.5)
WBC: 14 10*3/uL — ABNORMAL HIGH (ref 3.8–10.6)

## 2017-08-08 LAB — COMPREHENSIVE METABOLIC PANEL
ALT: 18 U/L (ref 0–44)
AST: 27 U/L (ref 15–41)
Albumin: 4.9 g/dL (ref 3.5–5.0)
Alkaline Phosphatase: 91 U/L (ref 38–126)
Anion gap: 12 (ref 5–15)
BUN: 21 mg/dL — AB (ref 6–20)
CHLORIDE: 102 mmol/L (ref 98–111)
CO2: 28 mmol/L (ref 22–32)
Calcium: 9.8 mg/dL (ref 8.9–10.3)
Creatinine, Ser: 0.95 mg/dL (ref 0.61–1.24)
GFR calc Af Amer: >60 mL/min (ref >60)
Glucose, Bld: 161 mg/dL — ABNORMAL HIGH (ref 70–99)
POTASSIUM: 3.9 mmol/L (ref 3.5–5.1)
SODIUM: 142 mmol/L (ref 135–145)
Total Bilirubin: 0.7 mg/dL (ref 0.3–1.2)
Total Protein: 9.1 g/dL — ABNORMAL HIGH (ref 6.5–8.1)

## 2017-08-08 LAB — APTT: aPTT: 30 seconds (ref 24–36)

## 2017-08-08 LAB — PROTIME-INR
INR: 1
Prothrombin Time: 13.1 seconds (ref 11.4–15.2)

## 2017-08-08 MED ORDER — ALBUTEROL SULFATE (2.5 MG/3ML) 0.083% IN NEBU: 2.5000 mg | INHALATION_SOLUTION | RESPIRATORY_TRACT | Status: DC | PRN

## 2017-08-08 MED ORDER — ONDANSETRON HCL 4 MG PO TABS: 4.0000 mg | ORAL_TABLET | Freq: Four times a day (QID) | ORAL | Status: DC | PRN

## 2017-08-08 MED ORDER — POLYETHYLENE GLYCOL 3350 17 G PO PACK: 17.0000 g | PACK | Freq: Every day | ORAL | Status: DC | PRN

## 2017-08-08 MED ORDER — DIPHENHYDRAMINE HCL 50 MG/ML IJ SOLN
25.0000 mg | Freq: Once | INTRAMUSCULAR | Status: AC
  Administered 2017-08-08: 25 mg via INTRAVENOUS

## 2017-08-08 MED ORDER — HYDROMORPHONE HCL 1 MG/ML IJ SOLN
INTRAMUSCULAR | Status: AC
  Administered 2017-08-08: 1 mg via INTRAVENOUS
  Filled 2017-08-08: qty 1

## 2017-08-08 MED ORDER — PROMETHAZINE HCL 25 MG/ML IJ SOLN
INTRAMUSCULAR | Status: AC
  Administered 2017-08-08: 25 mg via INTRAVENOUS
  Filled 2017-08-08: qty 1

## 2017-08-08 MED ORDER — HYDROMORPHONE HCL 1 MG/ML IJ SOLN
1.0000 mg | Freq: Once | INTRAMUSCULAR | Status: AC
  Administered 2017-08-08: 1 mg via INTRAVENOUS

## 2017-08-08 MED ORDER — DIPHENHYDRAMINE HCL 50 MG/ML IJ SOLN
12.5000 mg | Freq: Four times a day (QID) | INTRAMUSCULAR | Status: DC | PRN
  Administered 2017-08-08 – 2017-08-09 (×3): 12.5 mg via INTRAVENOUS
  Filled 2017-08-08 (×3): qty 1

## 2017-08-08 MED ORDER — ONDANSETRON HCL 4 MG/2ML IJ SOLN
INTRAMUSCULAR | Status: AC
  Administered 2017-08-08: 4 mg via INTRAVENOUS
  Filled 2017-08-08: qty 2

## 2017-08-08 MED ORDER — METOCLOPRAMIDE HCL 5 MG/ML IJ SOLN
5.0000 mg | Freq: Four times a day (QID) | INTRAMUSCULAR | Status: AC
  Administered 2017-08-09 (×3): 5 mg via INTRAVENOUS
  Filled 2017-08-08 (×4): qty 2

## 2017-08-08 MED ORDER — ONDANSETRON HCL 4 MG/2ML IJ SOLN
4.0000 mg | Freq: Four times a day (QID) | INTRAMUSCULAR | Status: DC | PRN
  Administered 2017-08-08 – 2017-08-10 (×7): 4 mg via INTRAVENOUS
  Filled 2017-08-08 (×8): qty 2

## 2017-08-08 MED ORDER — METOCLOPRAMIDE HCL 5 MG/ML IJ SOLN
10.0000 mg | Freq: Once | INTRAMUSCULAR | Status: AC
  Administered 2017-08-08: 10 mg via INTRAVENOUS

## 2017-08-08 MED ORDER — SODIUM CHLORIDE 0.9 % IV BOLUS
1000.0000 mL | Freq: Once | INTRAVENOUS | Status: AC
  Administered 2017-08-08: 1000 mL via INTRAVENOUS

## 2017-08-08 MED ORDER — MORPHINE SULFATE (PF) 4 MG/ML IV SOLN
4.0000 mg | Freq: Once | INTRAVENOUS | Status: AC
  Administered 2017-08-08: 4 mg via INTRAVENOUS
  Filled 2017-08-08: qty 1

## 2017-08-08 MED ORDER — ONDANSETRON HCL 4 MG/2ML IJ SOLN
4.0000 mg | Freq: Once | INTRAMUSCULAR | Status: AC
  Administered 2017-08-08: 4 mg via INTRAVENOUS

## 2017-08-08 MED ORDER — DOCUSATE SODIUM 100 MG PO CAPS
100.0000 mg | ORAL_CAPSULE | Freq: Two times a day (BID) | ORAL | Status: DC
  Administered 2017-08-09 – 2017-08-11 (×3): 100 mg via ORAL
  Filled 2017-08-08 (×5): qty 1

## 2017-08-08 MED ORDER — IOPAMIDOL (ISOVUE-300) INJECTION 61%
100.0000 mL | Freq: Once | INTRAVENOUS | Status: AC | PRN
  Administered 2017-08-08: 100 mL via INTRAVENOUS

## 2017-08-08 MED ORDER — ACETAMINOPHEN 325 MG PO TABS: 650.0000 mg | ORAL_TABLET | Freq: Four times a day (QID) | ORAL | Status: DC | PRN

## 2017-08-08 MED ORDER — MORPHINE SULFATE (PF) 2 MG/ML IV SOLN
2.0000 mg | INTRAVENOUS | Status: DC | PRN
  Administered 2017-08-08 – 2017-08-09 (×3): 2 mg via INTRAVENOUS
  Filled 2017-08-08 (×3): qty 1

## 2017-08-08 MED ORDER — PROMETHAZINE HCL 25 MG/ML IJ SOLN
25.0000 mg | Freq: Once | INTRAMUSCULAR | Status: AC
  Administered 2017-08-08: 25 mg via INTRAVENOUS

## 2017-08-08 MED ORDER — HYDROCODONE-ACETAMINOPHEN 5-325 MG PO TABS
1.0000 | ORAL_TABLET | ORAL | Status: DC | PRN
  Administered 2017-08-09 – 2017-08-11 (×9): 2 via ORAL
  Filled 2017-08-08 (×9): qty 2

## 2017-08-08 MED ORDER — DIPHENHYDRAMINE HCL 50 MG/ML IJ SOLN
INTRAMUSCULAR | Status: AC
  Filled 2017-08-08: qty 1

## 2017-08-08 MED ORDER — ONDANSETRON HCL 4 MG/2ML IJ SOLN
4.0000 mg | Freq: Once | INTRAMUSCULAR | Status: AC
  Administered 2017-08-08: 4 mg via INTRAVENOUS
  Filled 2017-08-08: qty 2

## 2017-08-08 MED ORDER — SODIUM CHLORIDE 0.9 % IV BOLUS
1000.0000 mL | Freq: Once | INTRAVENOUS | Status: AC
Start: 2017-08-08 — End: 2017-08-08
  Administered 2017-08-08: 1000 mL via INTRAVENOUS

## 2017-08-08 MED ORDER — ACETAMINOPHEN 650 MG RE SUPP: 650.0000 mg | Freq: Four times a day (QID) | RECTAL | Status: DC | PRN

## 2017-08-08 MED ORDER — SODIUM CHLORIDE 0.9 % IV SOLN
INTRAVENOUS | Status: DC
  Administered 2017-08-08 – 2017-08-09 (×3): via INTRAVENOUS

## 2017-08-08 NOTE — H&P (Signed)
[place] [facility] [place] NAME: Cameron Morton    MR#:  [id number]  DATE OF BIRTH:  1980/09/16  DATE OF ADMISSION:  08/08/2017  PRIMARY CARE PHYSICIAN: System, Pcp Not In   REQUESTING/REFERRING PHYSICIAN: Dr. Wynona Neat  CHIEF COMPLAINT:   Chief Complaint  Patient presents with  . Abdominal Pain    HISTORY OF PRESENT ILLNESS:  Cameron Morton  is a 37 y.o. male with a known history of hepatitis C, peptic ulcer disease presents to the hospital complaining of intractable abdominal pain and vomiting since yesterday.  Patient has been unable to keep any food or liquids down.  He has received multiple doses of nausea medications and pain medications with no significant improvement in symptoms.  CT scan of the abdomen and pelvis showed nothing acute other than a large hiatal hernia. Patient had similar symptoms 3 years back when he had EGD which showed esophagitis.  No peptic ulcer disease.  Symptoms improved on their own. Patient is being admitted for intractable abdominal pain and vomiting. He also mentions he had to episodes of melena over the last [redacted] week along with normal stools intermittently.  No blood in vomiting  PAST MEDICAL HISTORY:   Past Medical History:  Diagnosis Date  . Hepatitis C   . MRSA (methicillin resistant staph aureus) culture positive   . Stomach ulcer 2012    PAST SURGICAL HISTORY:   Past Surgical History:  Procedure Laterality Date  . BELOW KNEE LEG AMPUTATION    . ESOPHAGOGASTRODUODENOSCOPY N/A 09/12/2014   Procedure: ESOPHAGOGASTRODUODENOSCOPY (EGD);  Surgeon: Teena Irani, MD;  Location: [facility] ENDOSCOPY;  Service: Endoscopy;  Laterality: N/A;  . FLEXIBLE SIGMOIDOSCOPY N/A 09/10/2014   Procedure: FLEXIBLE SIGMOIDOSCOPY;  Surgeon: Teena Irani, MD;  Location: [facility] ENDOSCOPY;  Service: Endoscopy;  Laterality: N/A;    SOCIAL HISTORY:   Social History   Tobacco Use  . Smoking status: Current Every Day Smoker    Packs/day: 0.50     Types: Cigarettes  . Smokeless tobacco: Never Used  Substance Use Topics  . Alcohol use: No    FAMILY HISTORY:  No family history on file. No history of colon cancer  DRUG ALLERGIES:  No Known Allergies  REVIEW OF SYSTEMS:   Review of Systems  Constitutional: Positive for malaise/fatigue. Negative for chills and fever.  HENT: Negative for sore throat.   Eyes: Negative for blurred vision, double vision and pain.  Respiratory: Negative for cough, hemoptysis, shortness of breath and wheezing.   Cardiovascular: Negative for chest pain, palpitations, orthopnea and leg swelling.  Gastrointestinal: Positive for abdominal pain, melena, nausea and vomiting. Negative for constipation, diarrhea and heartburn.  Genitourinary: Negative for dysuria and hematuria.  Musculoskeletal: Negative for back pain and joint pain.  Skin: Negative for rash.  Neurological: Negative for sensory change, speech change, focal weakness and headaches.  Endo/Heme/Allergies: Does not bruise/bleed easily.  Psychiatric/Behavioral: Negative for depression. The patient is not nervous/anxious.     MEDICATIONS AT HOME:   Prior to Admission medications   Medication Sig Start Date End Date Taking? Authorizing Provider  HYDROcodone-acetaminophen (NORCO) 7.5-325 MG tablet Take 1 tablet by mouth every 6 (six) hours as needed for moderate pain.    Yes [provider]  clindamycin (CLEOCIN) 150 MG capsule Take 2 capsules (300 mg total) by mouth 3 (three) times daily. Patient not taking: Reported on 08/08/2017 06/30/17   Versie Starks, PA-C     VITAL SIGNS:  Blood pressure 119/69, pulse 61,  temperature 99.8 F (37.7 C), temperature source Oral, resp. rate 14, height 6' (1.829 m), weight 99.8 kg (220 lb), SpO2 99 %.  PHYSICAL EXAMINATION:  Physical Exam  GENERAL:  37 y.o.-year-old patient lying in the bed with no acute distress.  EYES: Pupils equal, round, reactive to light and accommodation. No scleral  icterus. Extraocular muscles intact.  HEENT: Head atraumatic, normocephalic. Oropharynx and nasopharynx clear. No oropharyngeal erythema, moist oral mucosa  NECK:  Supple, no jugular venous distention. No thyroid enlargement, no tenderness.  LUNGS: Normal breath sounds bilaterally, no wheezing, rales, rhonchi. No use of accessory muscles of respiration.  CARDIOVASCULAR: S1, S2 normal. No murmurs, rubs, or gallops.  ABDOMEN: Soft, epigastric and right upper quadrant tenderness, nondistended. Bowel sounds present. No organomegaly or mass.  EXTREMITIES: No pedal edema, cyanosis, or clubbing. + 2 pedal & radial pulses b/l.   NEUROLOGIC: Cranial nerves II through XII are intact. No focal Motor or sensory deficits appreciated b/l PSYCHIATRIC: The patient is alert and oriented x 3. Good affect.  SKIN: No obvious rash, lesion, or ulcer.   LABORATORY PANEL:   CBC Recent Labs  Lab 08/08/17 0818  WBC 14.0*  HGB 14.0  HCT 40.5  PLT 317   ------------------------------------------------------------------------------------------------------------------  Chemistries  Recent Labs  Lab 08/08/17 0818  NA 142  K 3.9  CL 102  CO2 28  GLUCOSE 161*  BUN 21*  CREATININE 0.95  CALCIUM 9.8  AST 27  ALT 18  ALKPHOS 91  BILITOT 0.7   ------------------------------------------------------------------------------------------------------------------  Cardiac Enzymes No results for input(s): TROPONINI in the last 168 hours. ------------------------------------------------------------------------------------------------------------------  RADIOLOGY:  Ct Abdomen Pelvis W Contrast  Result Date: 08/08/2017 CLINICAL DATA:  Acute generalized abdominal pain. Tarry stools for the past day. History of right low knee leg amputation. EXAM: CT ABDOMEN AND PELVIS WITH CONTRAST TECHNIQUE: Multidetector CT imaging of the abdomen and pelvis was performed using the standard protocol following bolus administration  of intravenous contrast. CONTRAST:  134mL ISOVUE-300 IOPAMIDOL (ISOVUE-300) INJECTION 61% COMPARISON:  CT abdomen pelvis-09/06/2014 FINDINGS: Lower chest: Limited visualization of the lower thorax demonstrates minimal dependent subpleural ground-glass atelectasis. No focal airspace opacities. Normal heart size.  No pericardial effusion. Hepatobiliary: Normal hepatic contour. No discrete hepatic lesions. Post cholecystectomy. No intra extrahepatic biliary ductal dilatation. Pancreas: Normal appearance of the pancreas Spleen: Normal appearance of the spleen Adrenals/Urinary Tract: There is symmetric enhancement of the bilateral kidneys. There is a punctate (approximately 3 mm) nonobstructing stone within the interpolar aspect the right kidney (coronal image 57, series 5) and a 1 mm nonobstructing stone within the inferior pole of the left kidney (coronal image 44, series 5). No renal stones are seen along expected course of either ureter or the urinary bladder. Normal noncontrast appearance of the urinary bladder given degree distention. No discrete renal lesions. Normal appearance the bilateral adrenal glands. Stomach/Bowel: Large hiatal hernia, progressed compared to the 08/2014 examination. No evidence of enteric obstruction. Normal appearance of the terminal ileum and appendix. No pneumoperitoneum, pneumatosis or portal venous gas. Vascular/Lymphatic: Normal caliber of the abdominal aorta. There is atrophy involving the right pelvic iliac system as well as the imaged portions of the right common, deep and superficial femoral arteries compatible with provided history of leg amputation and absence of the right femoral head. Reproductive: Normal appearance of the pelvic organs. No free fluid the pelvic cul-de-sac. Other: Atrophy of the right hip and pelvic musculature. Musculoskeletal: No acute or aggressive osseous abnormalities. Continued absorption of the right femoral head and deformity  of the right acetabulum.  Grossly unchanged left-sided L5 pars defects without associated anterolisthesis. Re-demonstrated fusion of the L1-L2 vertebral bodies. Unchanged moderate posteriorly directed disc osteophyte complex at T11-T12. IMPRESSION: 1. Large hiatal hernia, progressed compared to the 08/2014 examination. Otherwise, no explanation for patient's generalized abdominal pain. Specifically, no evidence of enteric obstruction. 2. Nonobstructing bilateral nephrolithiasis. 3. Continued absorption of the right femoral head with associated deformity of the right acetabulum, atrophy of the right pelvic and thigh musculature and arterial vasculature. Electronically Signed   By: Sandi Mariscal M.D.   On: 08/08/2017 12:55   IMPRESSION AND PLAN:   *Intractable abdominal pain and vomiting.  Likely gastritis, hiatal hernia.  Nothing acute on CT scan.  Will put him on PPIs, anti-emetics.  IV fluids.  Soft diet.  If no improvement will need to consult GI tomorrow. Hemoglobin stable.  2 episodes of melena which is unclear.  No blood in vomiting.  * History of hepatitis C.  Treated with Harvoni.  *DT prophylaxis with SCDs.  No Lovenox until GI bleed is ruled out.  All the records are reviewed and case discussed with ED provider. Management plans discussed with the patient, family and they are in agreement.  CODE STATUS: Full code  TOTAL TIME TAKING CARE OF THIS PATIENT: 40 minutes.   Leia Alf Jemel Ono M.D on 08/08/2017 at 2:46 PM  Between 7am to 6pm - Pager - [phone]  After 6pm go to [email or web address] - password [email or web address] [facility]  Office  [phone]  CC: Primary care physician; System, Pcp Not In  Note: This dictation was prepared with Dragon dictation along with smaller phrase technology. Any transcriptional errors that result from this process are unintentional.

## 2017-08-08 NOTE — ED Notes (Signed)
Attempt to get re-collect blood unsuccessful. Will wait on re-collect by lab

## 2017-08-08 NOTE — ED Notes (Signed)
ED Provider at bedside. 

## 2017-08-08 NOTE — ED Notes (Signed)
IV team at bedside 

## 2017-08-08 NOTE — ED Notes (Signed)
Patient refused to let this RN put him on heart monitor

## 2017-08-08 NOTE — ED Notes (Signed)
Lab contacted for redraw on blood

## 2017-08-08 NOTE — ED Notes (Signed)
Multiple nurse attempts at IV with no success. MD made aware. IV team consult put in and MD reports he will go in to do [place] when available

## 2017-08-08 NOTE — ED Provider Notes (Signed)
[facility] Emergency Department Provider Note   ____________________________________________    I have reviewed the triage vital signs and the nursing notes.   HISTORY  Chief Complaint Abdominal Pain     HPI Cameron Morton is a 37 y.o. male with a history of hepatitis C who completed a course of Harvoni who presents with complaints of epigastric and right upper quadrant abdominal pain as well as nausea vomiting and dark stools.  Patient reports he has had this before in 2016, confirmed by review of medical records.  States sharp pain, has not taken anything for it.  No hematemesis.  Has not taken any Pepto-Bismol or Imodium.  Most recent colonoscopy was normal.  Endoscopy done in 2016 did not show any ulcers.  Past Medical History:  Diagnosis Date  . Hepatitis C   . MRSA (methicillin resistant staph aureus) culture positive   . Stomach ulcer 2012    Patient Active Problem List   Diagnosis Date Noted  . Acute hepatitis 09/06/2014  . 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 Hepatitis C 09/06/2014  . GERD (gastroesophageal reflux disease) 09/06/2014  . Abdominal pain, right upper quadrant 09/06/2014  . Vomiting 09/06/2014  . GI bleed 09/06/2014    Past Surgical History:  Procedure Laterality Date  . BELOW KNEE LEG AMPUTATION    . ESOPHAGOGASTRODUODENOSCOPY N/A 09/12/2014   Procedure: ESOPHAGOGASTRODUODENOSCOPY (EGD);  Surgeon: Teena Irani, MD;  Location: [facility] ENDOSCOPY;  Service: Endoscopy;  Laterality: N/A;  . FLEXIBLE SIGMOIDOSCOPY N/A 09/10/2014   Procedure: FLEXIBLE SIGMOIDOSCOPY;  Surgeon: Teena Irani, MD;  Location: [facility] ENDOSCOPY;  Service: Endoscopy;  Laterality: N/A;    Prior to Admission medications   Medication Sig Start Date End Date Taking? Authorizing Provider  HYDROcodone-acetaminophen (NORCO) 7.5-325 MG tablet Take 1 tablet by mouth every 6 (six) hours as needed for moderate pain.    Yes [provider]  clindamycin (CLEOCIN) 150 MG capsule Take 2 capsules (300 mg total) by mouth 3 (three) times daily. Patient not taking: Reported on 08/08/2017 06/30/17   Versie Starks, PA-C     Allergies Patient has no known allergies.  No family history on file.  Social History Social History   Tobacco Use  . Smoking status: Current Every Day Smoker    Packs/day: 0.50    Types: Cigarettes  . Smokeless tobacco: Never Used  Substance Use Topics  . Alcohol use: No  . Drug use: No    Review of Systems  Constitutional: No fever/chills Eyes: No visual changes.  ENT: No sore throat. Cardiovascular: Denies chest pain. Respiratory: Denies shortness of breath. Gastrointestinal: As above Genitourinary: Negative for dysuria. Musculoskeletal: Negative for back pain. Skin: Negative for rash. Neurological: Negative for headaches   ____________________________________________   PHYSICAL EXAM:  VITAL SIGNS: ED Triage Vitals  Enc Vitals Group     BP 08/08/17 0715 139/83     Pulse Rate 08/08/17 0715 87     Resp 08/08/17 0715 18     Temp 08/08/17 0715 99.8 F (37.7 C)     Temp Source 08/08/17 0715 Oral     SpO2 08/08/17 0715 97 %     Weight 08/08/17 0713 99.8 kg (220 lb)     Height 08/08/17 0713 1.829 m (6')     Head Circumference --      Peak Flow --      Pain Score 08/08/17 0713 10     Pain Loc --  Pain Edu? --      Excl. in [place]? --     Constitutional: Alert and oriented.  Eyes: Conjunctivae are normal.   Nose: No congestion/rhinnorhea.  Cardiovascular: Normal rate, regular rhythm. Grossly normal heart sounds.  Good peripheral circulation. Respiratory: Normal respiratory effort.  No retractions. Lungs CTAB. Gastrointestinal: Mild tenderness in the epigastrium. No distention.  No CVA tenderness.  Musculoskeletal: Right AKA, warm and well perfused extremities Neurologic:  Normal speech and language. No gross focal neurologic deficits are appreciated.  Skin:  Skin is warm,  dry and intact. No rash noted. Psychiatric: Mood and affect are normal. Speech and behavior are normal.  ____________________________________________   LABS (all labs ordered are listed, but only abnormal results are displayed)  Labs Reviewed  CBC - Abnormal; Notable for the following components:      Result Value   WBC 14.0 (*)    RDW 16.1 (*)    All other components within normal limits  COMPREHENSIVE METABOLIC PANEL - Abnormal; Notable for the following components:   Glucose, Bld 161 (*)    BUN 21 (*)    Total Protein 9.1 (*)    All other components within normal limits  PROTIME-INR  APTT  TYPE AND SCREEN   ____________________________________________  EKG  None ____________________________________________  RADIOLOGY  CT abdomen pelvis negative for acute pathology ____________________________________________   PROCEDURES  Procedure(s) performed: yes  Angiocath insertion Performed by: Lavonia Drafts  Consent: Verbal consent obtained. Risks and benefits: risks, benefits and alternatives were discussed Time out: Immediately prior to procedure a "time out" was called to verify the correct patient, procedure, equipment, support staff and site/side marked as required.  Preparation: Patient was prepped and draped in the usual sterile fashion.  Vein Location: right brachial  Ultrasound Guided  Gauge: 18  Normal blood return and flush without difficulty Patient tolerance: Patient tolerated the procedure well with no immediate complications.      Critical Care performed: No ____________________________________________   INITIAL IMPRESSION / ASSESSMENT AND PLAN / ED COURSE  Pertinent labs & imaging results that were available during my care of the patient were reviewed by me and considered in my medical decision making (see chart for details).  Patient presents with nausea vomiting reported dark stools as above.  Differential includes gastritis, PUD,  hepatitis flare.  Will check labs including type and screen give IV morphine, IV Zofran and determine the need for imaging.   Patient states morphine did not help his pain.  Given IV Dilaudid x2 doses  CT abdomen pelvis does not demonstrate any acute abdominal pathology.  Discussed with hospitalist for admission    ____________________________________________   FINAL CLINICAL IMPRESSION(S) / ED DIAGNOSES  Final diagnoses:  Epigastric pain        Note:  This document was prepared using Dragon voice recognition software and may include unintentional dictation errors.    Lavonia Drafts, MD 08/08/17 [phone]

## 2017-08-08 NOTE — ED Triage Notes (Signed)
C/O abdominal pain, n/V, tarry stool x 1 day.  Patient states he has had similar symptoms in the past, which was due to liver inflammation.

## 2017-08-08 NOTE — ED Notes (Signed)
Patient transported to CT 

## 2017-08-09 LAB — CBC
HCT: 34.4 % — ABNORMAL LOW (ref 40.0–52.0)
Hemoglobin: 11.8 g/dL — ABNORMAL LOW (ref 13.0–18.0)
MCH: 27.9 pg (ref 26.0–34.0)
MCHC: 34.3 g/dL (ref 32.0–36.0)
MCV: 81.4 fL (ref 80.0–100.0)
Platelets: 259 10*3/uL (ref 150–440)
RBC: 4.23 MIL/uL — ABNORMAL LOW (ref 4.40–5.90)
RDW: 16.6 % — ABNORMAL HIGH (ref 11.5–14.5)
WBC: 9.6 10*3/uL (ref 3.8–10.6)

## 2017-08-09 LAB — COMPREHENSIVE METABOLIC PANEL
ALT: 11 U/L (ref 0–44)
AST: 20 U/L (ref 15–41)
Albumin: 3.9 g/dL (ref 3.5–5.0)
Alkaline Phosphatase: 64 U/L (ref 38–126)
Anion gap: 8 (ref 5–15)
BUN: 15 mg/dL (ref 6–20)
CHLORIDE: 106 mmol/L (ref 98–111)
CO2: 28 mmol/L (ref 22–32)
Calcium: 8.2 mg/dL — ABNORMAL LOW (ref 8.9–10.3)
Creatinine, Ser: 0.87 mg/dL (ref 0.61–1.24)
GFR calc Af Amer: >60 mL/min (ref >60)
GFR calc non Af Amer: >60 mL/min (ref >60)
Glucose, Bld: 123 mg/dL — ABNORMAL HIGH (ref 70–99)
Potassium: 3.2 mmol/L — ABNORMAL LOW (ref 3.5–5.1)
Sodium: 142 mmol/L (ref 135–145)
Total Bilirubin: 0.6 mg/dL (ref 0.3–1.2)
Total Protein: 7.1 g/dL (ref 6.5–8.1)

## 2017-08-09 LAB — LIPASE, BLOOD: LIPASE: 145 U/L — AB (ref 11–51)

## 2017-08-09 LAB — LIPID PANEL
Cholesterol: 145 mg/dL (ref 0–200)
HDL: 26 mg/dL — ABNORMAL LOW (ref >40)
LDL CALC: 102 mg/dL — AB (ref 0–99)
Total CHOL/HDL Ratio: 5.6 RATIO
Triglycerides: 85 mg/dL (ref <150)
VLDL: 17 mg/dL (ref 0–40)

## 2017-08-09 LAB — SEDIMENTATION RATE: Sed Rate: 30 mm/hr — ABNORMAL HIGH (ref 0–15)

## 2017-08-09 LAB — LACTIC ACID, PLASMA
Lactic Acid, Venous: 0.7 mmol/L (ref 0.5–1.9)
Lactic Acid, Venous: 1.6 mmol/L (ref 0.5–1.9)

## 2017-08-09 MED ORDER — MORPHINE SULFATE (PF) 2 MG/ML IV SOLN
2.0000 mg | INTRAVENOUS | Status: DC | PRN
  Administered 2017-08-09 – 2017-08-10 (×9): 4 mg via INTRAVENOUS
  Administered 2017-08-10 (×3): 2 mg via INTRAVENOUS
  Administered 2017-08-10 – 2017-08-11 (×6): 4 mg via INTRAVENOUS
  Filled 2017-08-09 (×2): qty 2
  Filled 2017-08-09: qty 1
  Filled 2017-08-09: qty 2
  Filled 2017-08-09: qty 1
  Filled 2017-08-09: qty 2
  Filled 2017-08-09: qty 1
  Filled 2017-08-09 (×6): qty 2
  Filled 2017-08-09: qty 1
  Filled 2017-08-09 (×2): qty 2
  Filled 2017-08-09: qty 1
  Filled 2017-08-09 (×2): qty 2

## 2017-08-09 MED ORDER — FAMOTIDINE IN NACL 20-0.9 MG/50ML-% IV SOLN
20.0000 mg | Freq: Two times a day (BID) | INTRAVENOUS | Status: DC
  Administered 2017-08-09 – 2017-08-10 (×3): 20 mg via INTRAVENOUS
  Filled 2017-08-09 (×3): qty 50

## 2017-08-09 MED ORDER — HYDROMORPHONE HCL 1 MG/ML IJ SOLN
1.0000 mg | Freq: Once | INTRAMUSCULAR | Status: AC
  Administered 2017-08-09: 1 mg via INTRAVENOUS
  Filled 2017-08-09: qty 1

## 2017-08-09 MED ORDER — POTASSIUM CHLORIDE CRYS ER 20 MEQ PO TBCR
40.0000 meq | EXTENDED_RELEASE_TABLET | Freq: Once | ORAL | Status: AC
Start: 2017-08-09 — End: 2017-08-09
  Administered 2017-08-09: 40 meq via ORAL
  Filled 2017-08-09: qty 2

## 2017-08-09 NOTE — Progress Notes (Signed)
[facility] [place] NAME: Cameron Morton    MR#:  [id number]  DATE OF BIRTH:  09-14-1980  SUBJECTIVE:  CHIEF COMPLAINT:   Chief Complaint  Patient presents with  . Abdominal Pain  Patient continues to complain of abdominal pain, elevated lipase noted concerning for pancreatitis, follow-up on abdominal ultrasound, check lipid studies, gastroenterology to see  REVIEW OF SYSTEMS:  CONSTITUTIONAL: No fever, fatigue or weakness.  EYES: No blurred or double vision.  EARS, NOSE, AND THROAT: No tinnitus or ear pain.  RESPIRATORY: No cough, shortness of breath, wheezing or hemoptysis.  CARDIOVASCULAR: No chest pain, orthopnea, edema.  GASTROINTESTINAL: No nausea, vomiting, diarrhea or abdominal pain.  GENITOURINARY: No dysuria, hematuria.  ENDOCRINE: No polyuria, nocturia,  HEMATOLOGY: No anemia, easy bruising or bleeding SKIN: No rash or lesion. MUSCULOSKELETAL: No joint pain or arthritis.   NEUROLOGIC: No tingling, numbness, weakness.  PSYCHIATRY: No anxiety or depression.   ROS  DRUG ALLERGIES:  No Known Allergies  VITALS:  Blood pressure (!) 148/80, pulse 60, temperature 98.2 F (36.8 C), temperature source Oral, resp. rate 20, height 6' (1.829 m), weight 100.7 kg (222 lb 0.1 oz), SpO2 100 %.  PHYSICAL EXAMINATION:  GENERAL:  37 y.o.-year-old patient lying in the bed with no acute distress.  EYES: Pupils equal, round, reactive to light and accommodation. No scleral icterus. Extraocular muscles intact.  HEENT: Head atraumatic, normocephalic. Oropharynx and nasopharynx clear.  NECK:  Supple, no jugular venous distention. No thyroid enlargement, no tenderness.  LUNGS: Normal breath sounds bilaterally, no wheezing, rales,rhonchi or crepitation. No use of accessory muscles of respiration.  CARDIOVASCULAR: S1, S2 normal. No murmurs, rubs, or gallops.  ABDOMEN: Soft, nontender, nondistended. Bowel sounds present. No organomegaly or mass.   EXTREMITIES: No pedal edema, cyanosis, or clubbing.  NEUROLOGIC: Cranial nerves II through XII are intact. Muscle strength 5/5 in all extremities. Sensation intact. Gait not checked.  PSYCHIATRIC: The patient is alert and oriented x 3.  SKIN: No obvious rash, lesion, or ulcer.   Physical Exam LABORATORY PANEL:   CBC Recent Labs  Lab 08/09/17 0758  WBC 9.6  HGB 11.8*  HCT 34.4*  PLT 259   ------------------------------------------------------------------------------------------------------------------  Chemistries  Recent Labs  Lab 08/09/17 0758  NA 142  K 3.2*  CL 106  CO2 28  GLUCOSE 123*  BUN 15  CREATININE 0.87  CALCIUM 8.2*  AST 20  ALT 11  ALKPHOS 64  BILITOT 0.6   ------------------------------------------------------------------------------------------------------------------  Cardiac Enzymes No results for input(s): TROPONINI in the last 168 hours. ------------------------------------------------------------------------------------------------------------------  RADIOLOGY:  Ct Abdomen Pelvis W Contrast  Result Date: 08/08/2017 CLINICAL DATA:  Acute generalized abdominal pain. Tarry stools for the past day. History of right low knee leg amputation. EXAM: CT ABDOMEN AND PELVIS WITH CONTRAST TECHNIQUE: Multidetector CT imaging of the abdomen and pelvis was performed using the standard protocol following bolus administration of intravenous contrast. CONTRAST:  19mL ISOVUE-300 IOPAMIDOL (ISOVUE-300) INJECTION 61% COMPARISON:  CT abdomen pelvis-09/06/2014 FINDINGS: Lower chest: Limited visualization of the lower thorax demonstrates minimal dependent subpleural ground-glass atelectasis. No focal airspace opacities. Normal heart size.  No pericardial effusion. Hepatobiliary: Normal hepatic contour. No discrete hepatic lesions. Post cholecystectomy. No intra extrahepatic biliary ductal dilatation. Pancreas: Normal appearance of the pancreas Spleen: Normal appearance of  the spleen Adrenals/Urinary Tract: There is symmetric enhancement of the bilateral kidneys. There is a punctate (approximately 3 mm) nonobstructing stone within the interpolar aspect the right kidney (coronal image 57, series 5)  and a 1 mm nonobstructing stone within the inferior pole of the left kidney (coronal image 44, series 5). No renal stones are seen along expected course of either ureter or the urinary bladder. Normal noncontrast appearance of the urinary bladder given degree distention. No discrete renal lesions. Normal appearance the bilateral adrenal glands. Stomach/Bowel: Large hiatal hernia, progressed compared to the 08/2014 examination. No evidence of enteric obstruction. Normal appearance of the terminal ileum and appendix. No pneumoperitoneum, pneumatosis or portal venous gas. Vascular/Lymphatic: Normal caliber of the abdominal aorta. There is atrophy involving the right pelvic iliac system as well as the imaged portions of the right common, deep and superficial femoral arteries compatible with provided history of leg amputation and absence of the right femoral head. Reproductive: Normal appearance of the pelvic organs. No free fluid the pelvic cul-de-sac. Other: Atrophy of the right hip and pelvic musculature. Musculoskeletal: No acute or aggressive osseous abnormalities. Continued absorption of the right femoral head and deformity of the right acetabulum. Grossly unchanged left-sided L5 pars defects without associated anterolisthesis. Re-demonstrated fusion of the L1-L2 vertebral bodies. Unchanged moderate posteriorly directed disc osteophyte complex at T11-T12. IMPRESSION: 1. Large hiatal hernia, progressed compared to the 08/2014 examination. Otherwise, no explanation for patient's generalized abdominal pain. Specifically, no evidence of enteric obstruction. 2. Nonobstructing bilateral nephrolithiasis. 3. Continued absorption of the right femoral head with associated deformity of the right  acetabulum, atrophy of the right pelvic and thigh musculature and arterial vasculature. Electronically Signed   By: Sandi Mariscal M.D.   On: 08/08/2017 12:55    ASSESSMENT AND PLAN:  *Acute on chronic intermittent abdominal pain with emesis  Etiology unknown  ?  Secondary to pancreatitis versus peptic ulcer disease  Check abdominal ultrasound, check lipids, consult gastroenterology for expert opinion, IV fluids for rehydration, n.p.o. for now, antiemetics PRN, adult pain protocol, Pepcid twice daily, and continue close medical monitoring  CT abdomen noted, lactic acid and sed rate were normal  *Acute ?  Pancreatitis Etiology unknown Check lipid panel, CT abdomen noted, check abdominal ultrasound for further evaluation  *Acute ?  Melena Gastroenterology to see, n.p.o. except for meds for now, CBC daily and transfuse as needed  *History of hepatitis C Treated with Harvoni  Disposition pending clinical course  All the records are reviewed and case discussed with Care Management/Social Workerr. Management plans discussed with the patient, family and they are in agreement.  CODE STATUS: full  TOTAL TIME TAKING CARE OF THIS PATIENT: 35 minutes.     POSSIBLE D/C IN 1-2 DAYS, DEPENDING ON CLINICAL CONDITION.   Avel Peace Damesha Lawler M.D on 08/09/2017   Between 7am to 6pm - Pager - [phone]  After 6pm go to [email or web address] - password [email or web address] [facility]  Office  [phone]  CC: Primary care physician; System, Pcp Not In  Note: This dictation was prepared with Dragon dictation along with smaller phrase technology. Any transcriptional errors that result from this process are unintentional.

## 2017-08-09 NOTE — Progress Notes (Signed)
Called Dr. Jodell Cipro regarding patient's complaints of increased pain.  Appropriate orders were placed.  Will continue to monitor patient.  Cameron Morton  08/09/2017  2:18 AM

## 2017-08-09 NOTE — H&P (View-Only) (Signed)
GI Inpatient Consult Note  Reason for Consult: Abdominal pain, nausea and vomiting   Attending Requesting Consult: Mr. Cameron Hering, MD  History of Present Illness: Cameron Morton is a 37 y.o. male seen for evaluation of epigastric/RUQ abdominal pain with associated nausea and vomiting at the request of Dr. Jerelyn Charles, MD. Patient has a PMH of hx of hepatitis C treated with Harvoni, hx of peptic ulcer disease, and GERD. He was admitted yesterday for intractable abdominal pain, nausea and vomiting. Notable labs yesterday - Hgb 14.0, AST 27, ALT 18, bilirubin 0.7. Hgb dropped to 11.8 today, LFTs normal, ESR 30, and lipase 145. CT abd/pelvis showed large hiatal hernia, no evidence of enteric obstruction, normal appearance of pancreas, no intra or extrahepatic biliary ductal dilatation.   Patient seen and examined this afternoon reporting 10/10 epigastric/RUQ abd pain. He reports the pain is sharp, stabbing in nature and does not radiate to his back or shoulder blades. Nothing seems to relieve the pain. He endorses severe nausea and numerous episodes of emesis that have been non-bilious and non-bloody. Yesterday he endorses several episodes of black, tarry stools. He noticed this last week as well but started to have regular, formed brown BMs so he didn't think much of it. He does not take frequent NSAIDs. Last EGD 08/2014 at [facility] showed gastric erythema which was attributed to repeated retching.    Last Colonoscopy: Flex sig 08/2014 Last Endoscopy: 08/2014   Past Medical History:  Past Medical History:  Diagnosis Date  . Hepatitis C   . MRSA (methicillin resistant staph aureus) culture positive   . Stomach ulcer 2012    Problem List: Patient Active Problem List   Diagnosis Date Noted  . Abdominal pain 08/08/2017  . Acute hepatitis 09/06/2014  . 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 Hepatitis C 09/06/2014  . GERD (gastroesophageal reflux disease) 09/06/2014   . Abdominal pain, right upper quadrant 09/06/2014  . Vomiting 09/06/2014  . GI bleed 09/06/2014    Past Surgical History: Past Surgical History:  Procedure Laterality Date  . BELOW KNEE LEG AMPUTATION    . ESOPHAGOGASTRODUODENOSCOPY N/A 09/12/2014   Procedure: ESOPHAGOGASTRODUODENOSCOPY (EGD);  Surgeon: Cameron Irani, MD;  Location[facility] ENDOSCOPY;  Service: Endoscopy;  Laterality: N/A;  . FLEXIBLE SIGMOIDOSCOPY N/A 09/10/2014   Procedure: FLEXIBLE SIGMOIDOSCOPY;  Surgeon: Cameron Irani, MD;  Location: [facility] ENDOSCOPY;  Service: Endoscopy;  Laterality: N/A;    Allergies: No Known Allergies  Home Medications: Medications Prior to Admission  Medication Sig Dispense Refill Last Dose  . HYDROcodone-acetaminophen (NORCO) 7.5-325 MG tablet Take 1 tablet by mouth every 6 (six) hours as needed for moderate pain.    PRN at PRN  . clindamycin (CLEOCIN) 150 MG capsule Take 2 capsules (300 mg total) by mouth 3 (three) times daily. (Patient not taking: Reported on 08/08/2017) 42 capsule 0 Not Taking at Unknown time   Home medication reconciliation was completed with the patient.   Scheduled Inpatient Medications:   . docusate sodium  100 mg Oral BID    Continuous Inpatient Infusions:   . sodium chloride 100 mL/hr at 08/09/17 1359  . famotidine (PEPCID) IV 20 mg (08/09/17 1359)    PRN Inpatient Medications:  acetaminophen **OR** acetaminophen, albuterol, diphenhydrAMINE, HYDROcodone-acetaminophen, morphine injection, ondansetron **OR** ondansetron (ZOFRAN) IV, polyethylene glycol  Family History: family history is not on file.  The patient's family history is negative for inflammatory bowel disorders, GI malignancy, or solid organ transplantation.  Social History:   reports that  he has been smoking cigarettes.  He has been smoking about 0.50 packs per day. He has never used smokeless tobacco. He reports that he does not drink alcohol or use drugs. The patient denies ETOH, tobacco, or drug use.    Review of Systems: Constitutional: Weight is stable.  Eyes: No changes in vision. ENT: No oral lesions, sore throat.  GI: see HPI.  Heme/Lymph: No easy bruising.  CV: No chest pain.  GU: No hematuria.  Integumentary: No rashes.  Neuro: No headaches.  Psych: No depression/anxiety.  Endocrine: No heat/cold intolerance.  Allergic/Immunologic: No urticaria.  Resp: No cough, SOB.  Musculoskeletal: No joint swelling.    Physical Examination: BP (!) 148/80   Pulse 60   Temp 98.2 F (36.8 C) (Oral)   Resp 20   Ht 6' (1.829 m)   Wt 100.7 kg (222 lb 0.1 oz)   SpO2 100%   BMI 30.11 kg/m  Non-toxic appearing male in hospital bed. Answers all questions appropriately. Gen: NAD, alert and oriented x 4 HEENT: PEERLA, EOMI, Neck: supple, no JVD or thyromegaly Chest: CTA bilaterally, no wheezes, crackles, or other adventitious sounds CV: RRR, no m/g/c/r Abd: soft, tender to palpation in epigastric and RUQ, ND, +BS in all four quadrants; no HSM, guarding, ridigity, or rebound tenderness Ext: no edema, well perfused with 2+ pulses, Skin: no rash or lesions noted Lymph: no LAD  Data: Lab Results  Component Value Date   WBC 9.6 08/09/2017   HGB 11.8 (L) 08/09/2017   HCT 34.4 (L) 08/09/2017   MCV 81.4 08/09/2017   PLT 259 08/09/2017   Recent Labs  Lab 08/08/17 0818 08/09/17 0758  HGB 14.0 11.8*   Lab Results  Component Value Date   NA 142 08/09/2017   K 3.2 (L) 08/09/2017   CL 106 08/09/2017   CO2 28 08/09/2017   BUN 15 08/09/2017   CREATININE 0.87 08/09/2017   Lab Results  Component Value Date   ALT 11 08/09/2017   AST 20 08/09/2017   ALKPHOS 64 08/09/2017   BILITOT 0.6 08/09/2017   Recent Labs  Lab 08/08/17 1602  APTT 30  INR 1.00   Assessment/Plan: Mr. Cameron Morton is a 37 y.o. male with a PMH of hx of hepatitis C treated with harvoni, hx of PUD, and GERD admitted for intractable epigastric/RUQ abd pain with associated nausea and vomiting  1. Epigastric/RUQ  abdominal pain 2. Nausea and vomiting 3. Melena - Symptoms are consistent with PUD vs gastritis +/- H pylori. With melena presents, concerned about possibility of upper GI bleed especially with hemoglobin drop to 11.8. CT scan negative for intra-abdominal pathology and LFTs normal. Ddx also includes esophagitis, GERD, duodenitis, functional, pancreatitis. - Continue Pepcid, anti-emetics as needed. Symptomatic management - Avoid all NSAIDs - Recommend further evaluation with EGD to examine esophagus, stomach, and duodenum. Discussed procedure details and indications with pt. We discussed potential risks and complications, including bleeding, infection, puncture to esophagus or stomach, or problems with anesthesia. He consents to proceed. - Clear liquid diet today. NPO after 0500 tomorrow. - Plan for EGD tomorrow with Dr. Alice Reichert    Thank you for the consult. Please call with questions or concerns.  Geanie Kenning, PA-C [place]

## 2017-08-09 NOTE — Consult Note (Signed)
GI Inpatient Consult Note  Reason for Consult: Abdominal pain, nausea and vomiting   Attending Requesting Consult: Mr. Cameron Hering, MD  History of Present Illness: Cameron Morton is a 37 y.o. male seen for evaluation of epigastric/RUQ abdominal pain with associated nausea and vomiting at the request of Dr. Jerelyn Charles, MD. Patient has a PMH of hx of hepatitis C treated with Harvoni, hx of peptic ulcer disease, and GERD. He was admitted yesterday for intractable abdominal pain, nausea and vomiting. Notable labs yesterday - Hgb 14.0, AST 27, ALT 18, bilirubin 0.7. Hgb dropped to 11.8 today, LFTs normal, ESR 30, and lipase 145. CT abd/pelvis showed large hiatal hernia, no evidence of enteric obstruction, normal appearance of pancreas, no intra or extrahepatic biliary ductal dilatation.   Patient seen and examined this afternoon reporting 10/10 epigastric/RUQ abd pain. He reports the pain is sharp, stabbing in nature and does not radiate to his back or shoulder blades. Nothing seems to relieve the pain. He endorses severe nausea and numerous episodes of emesis that have been non-bilious and non-bloody. Yesterday he endorses several episodes of black, tarry stools. He noticed this last week as well but started to have regular, formed brown BMs so he didn't think much of it. He does not take frequent NSAIDs. Last EGD 08/2014 at [facility] showed gastric erythema which was attributed to repeated retching.    Last Colonoscopy: Flex sig 08/2014 Last Endoscopy: 08/2014   Past Medical History:  Past Medical History:  Diagnosis Date  . Hepatitis C   . MRSA (methicillin resistant staph aureus) culture positive   . Stomach ulcer 2012    Problem List: Patient Active Problem List   Diagnosis Date Noted  . Abdominal pain 08/08/2017  . Acute hepatitis 09/06/2014  . 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 Hepatitis C 09/06/2014  . GERD (gastroesophageal reflux disease) 09/06/2014   . Abdominal pain, right upper quadrant 09/06/2014  . Vomiting 09/06/2014  . GI bleed 09/06/2014    Past Surgical History: Past Surgical History:  Procedure Laterality Date  . BELOW KNEE LEG AMPUTATION    . ESOPHAGOGASTRODUODENOSCOPY N/A 09/12/2014   Procedure: ESOPHAGOGASTRODUODENOSCOPY (EGD);  Surgeon: Cameron Irani, MD;  Location: [facility] ENDOSCOPY;  Service: Endoscopy;  Laterality: N/A;  . FLEXIBLE SIGMOIDOSCOPY N/A 09/10/2014   Procedure: FLEXIBLE SIGMOIDOSCOPY;  Surgeon: Cameron Irani, MD;  Location: [facility] ENDOSCOPY;  Service: Endoscopy;  Laterality: N/A;    Allergies: No Known Allergies  Home Medications: Medications Prior to Admission  Medication Sig Dispense Refill Last Dose  . HYDROcodone-acetaminophen (NORCO) 7.5-325 MG tablet Take 1 tablet by mouth every 6 (six) hours as needed for moderate pain.    PRN at PRN  . clindamycin (CLEOCIN) 150 MG capsule Take 2 capsules (300 mg total) by mouth 3 (three) times daily. (Patient not taking: Reported on 08/08/2017) 42 capsule 0 Not Taking at Unknown time   Home medication reconciliation was completed with the patient.   Scheduled Inpatient Medications:   . docusate sodium  100 mg Oral BID    Continuous Inpatient Infusions:   . sodium chloride 100 mL/hr at 08/09/17 1359  . famotidine (PEPCID) IV 20 mg (08/09/17 1359)    PRN Inpatient Medications:  acetaminophen **OR** acetaminophen, albuterol, diphenhydrAMINE, HYDROcodone-acetaminophen, morphine injection, ondansetron **OR** ondansetron (ZOFRAN) IV, polyethylene glycol  Family History: family history is not on file.  The patient's family history is negative for inflammatory bowel disorders, GI malignancy, or solid organ transplantation.  Social History:   reports that  he has been smoking cigarettes.  He has been smoking about 0.50 packs per day. He has never used smokeless tobacco. He reports that he does not drink alcohol or use drugs. The patient denies ETOH, tobacco, or drug use.    Review of Systems: Constitutional: Weight is stable.  Eyes: No changes in vision. ENT: No oral lesions, sore throat.  GI: see HPI.  Heme/Lymph: No easy bruising.  CV: No chest pain.  GU: No hematuria.  Integumentary: No rashes.  Neuro: No headaches.  Psych: No depression/anxiety.  Endocrine: No heat/cold intolerance.  Allergic/Immunologic: No urticaria.  Resp: No cough, SOB.  Musculoskeletal: No joint swelling.    Physical Examination: BP (!) 148/80   Pulse 60   Temp 98.2 F (36.8 C) (Oral)   Resp 20   Ht 6' (1.829 m)   Wt 100.7 kg (222 lb 0.1 oz)   SpO2 100%   BMI 30.11 kg/m  Non-toxic appearing male in hospital bed. Answers all questions appropriately. Gen: NAD, alert and oriented x 4 HEENT: PEERLA, EOMI, Neck: supple, no JVD or thyromegaly Chest: CTA bilaterally, no wheezes, crackles, or other adventitious sounds CV: RRR, no m/g/c/r Abd: soft, tender to palpation in epigastric and RUQ, ND, +BS in all four quadrants; no HSM, guarding, ridigity, or rebound tenderness Ext: no edema, well perfused with 2+ pulses, Skin: no rash or lesions noted Lymph: no LAD  Data: Lab Results  Component Value Date   WBC 9.6 08/09/2017   HGB 11.8 (L) 08/09/2017   HCT 34.4 (L) 08/09/2017   MCV 81.4 08/09/2017   PLT 259 08/09/2017   Recent Labs  Lab 08/08/17 0818 08/09/17 0758  HGB 14.0 11.8*   Lab Results  Component Value Date   NA 142 08/09/2017   K 3.2 (L) 08/09/2017   CL 106 08/09/2017   CO2 28 08/09/2017   BUN 15 08/09/2017   CREATININE 0.87 08/09/2017   Lab Results  Component Value Date   ALT 11 08/09/2017   AST 20 08/09/2017   ALKPHOS 64 08/09/2017   BILITOT 0.6 08/09/2017   Recent Labs  Lab 08/08/17 1602  APTT 30  INR 1.00   Assessment/Plan: Mr. Cameron Morton is a 37 y.o. male with a PMH of hx of hepatitis C treated with harvoni, hx of PUD, and GERD admitted for intractable epigastric/RUQ abd pain with associated nausea and vomiting  1. Epigastric/RUQ  abdominal pain 2. Nausea and vomiting 3. Melena - Symptoms are consistent with PUD vs gastritis +/- H pylori. With melena presents, concerned about possibility of upper GI bleed especially with hemoglobin drop to 11.8. CT scan negative for intra-abdominal pathology and LFTs normal. Ddx also includes esophagitis, GERD, duodenitis, functional, pancreatitis. - Continue Pepcid, anti-emetics as needed. Symptomatic management - Avoid all NSAIDs - Recommend further evaluation with EGD to examine esophagus, stomach, and duodenum. Discussed procedure details and indications with pt. We discussed potential risks and complications, including bleeding, infection, puncture to esophagus or stomach, or problems with anesthesia. He consents to proceed. - Clear liquid diet today. NPO after 0500 tomorrow. - Plan for EGD tomorrow with Dr. Alice Reichert    Thank you for the consult. Please call with questions or concerns.  Geanie Kenning, PA-C [place]

## 2017-08-10 ENCOUNTER — Encounter: Payer: Self-pay | Admitting: Anesthesiology

## 2017-08-10 ENCOUNTER — Inpatient Hospital Stay: Payer: Medicaid Other | Admitting: Anesthesiology

## 2017-08-10 ENCOUNTER — Inpatient Hospital Stay: Payer: Medicaid Other

## 2017-08-10 ENCOUNTER — Encounter
Admission: EM | Disposition: A | Discharge status: Home / Self Care | Payer: Self-pay | Source: Home / Self Care | Attending: Family Medicine

## 2017-08-10 HISTORY — PX: ESOPHAGOGASTRODUODENOSCOPY: SHX5428

## 2017-08-10 LAB — BASIC METABOLIC PANEL
Anion gap: 7 (ref 5–15)
BUN: 12 mg/dL (ref 6–20)
CHLORIDE: 108 mmol/L (ref 98–111)
CO2: 26 mmol/L (ref 22–32)
Calcium: 8.1 mg/dL — ABNORMAL LOW (ref 8.9–10.3)
Creatinine, Ser: 0.68 mg/dL (ref 0.61–1.24)
Glucose, Bld: 108 mg/dL — ABNORMAL HIGH (ref 70–99)
POTASSIUM: 3.5 mmol/L (ref 3.5–5.1)
SODIUM: 141 mmol/L (ref 135–145)

## 2017-08-10 LAB — CBC
HEMATOCRIT: 31.3 % — AB (ref 40.0–52.0)
HEMOGLOBIN: 10.9 g/dL — AB (ref 13.0–18.0)
MCH: 28.5 pg (ref 26.0–34.0)
MCHC: 34.8 g/dL (ref 32.0–36.0)
MCV: 81.9 fL (ref 80.0–100.0)
Platelets: 203 10*3/uL (ref 150–440)
RBC: 3.82 MIL/uL — ABNORMAL LOW (ref 4.40–5.90)
RDW: 16 % — AB (ref 11.5–14.5)
WBC: 7 10*3/uL (ref 3.8–10.6)

## 2017-08-10 LAB — MAGNESIUM: MAGNESIUM: 2 mg/dL (ref 1.7–2.4)

## 2017-08-10 LAB — HIV ANTIBODY (ROUTINE TESTING W REFLEX): HIV Screen 4th Generation wRfx: NONREACTIVE

## 2017-08-10 SURGERY — EGD (ESOPHAGOGASTRODUODENOSCOPY)
Anesthesia: General

## 2017-08-10 MED ORDER — PROPOFOL 10 MG/ML IV BOLUS
INTRAVENOUS | Status: DC | PRN
  Administered 2017-08-10 (×2): 100 mg via INTRAVENOUS

## 2017-08-10 MED ORDER — FENTANYL CITRATE (PF) 100 MCG/2ML IJ SOLN
INTRAMUSCULAR | Status: AC
  Filled 2017-08-10: qty 2

## 2017-08-10 MED ORDER — FAMOTIDINE 20 MG PO TABS
20.0000 mg | ORAL_TABLET | Freq: Two times a day (BID) | ORAL | Status: DC
  Administered 2017-08-10 – 2017-08-11 (×2): 20 mg via ORAL
  Filled 2017-08-10 (×2): qty 1

## 2017-08-10 MED ORDER — FENTANYL CITRATE (PF) 100 MCG/2ML IJ SOLN
INTRAMUSCULAR | Status: DC | PRN
  Administered 2017-08-10: 50 ug via INTRAVENOUS
  Administered 2017-08-10 (×2): 25 ug via INTRAVENOUS

## 2017-08-10 MED ORDER — ONDANSETRON HCL 4 MG/2ML IJ SOLN
4.0000 mg | INTRAMUSCULAR | Status: DC | PRN
  Administered 2017-08-10 – 2017-08-11 (×5): 4 mg via INTRAVENOUS
  Filled 2017-08-10 (×4): qty 2

## 2017-08-10 MED ORDER — LIDOCAINE HCL (CARDIAC) PF 100 MG/5ML IV SOSY
PREFILLED_SYRINGE | INTRAVENOUS | Status: DC | PRN
  Administered 2017-08-10: 80 mg via INTRAVENOUS

## 2017-08-10 MED ORDER — ONDANSETRON HCL 4 MG PO TABS: 4.0000 mg | ORAL_TABLET | ORAL | Status: DC | PRN

## 2017-08-10 NOTE — Progress Notes (Signed)
IV team attempted to place a midline but was unsuccessful.  Patient was insistent on using the IV that was there.  I flushed it first with saline and there was not any more swelling and patient did not complain of any pain.  I pushed the Morphine and Zofran with not problems.

## 2017-08-10 NOTE — Interval H&P Note (Signed)
History and Physical Interval Note:  08/10/2017 12:59 PM  Cameron Morton  has presented today for surgery, with the diagnosis of Melena, epigastric pain, nausea and vomiting  The various methods of treatment have been discussed with the patient and family. After consideration of risks, benefits and other options for treatment, the patient has consented to  Procedure(s): ESOPHAGOGASTRODUODENOSCOPY (EGD) (N/A) as a surgical intervention .  The patient's history has been reviewed, patient examined, no change in status, stable for surgery.  I have reviewed the patient's chart and labs.  Questions were answered to the patient's satisfaction.     [place], [place]

## 2017-08-10 NOTE — Progress Notes (Signed)
[place] [facility] [place] NAME: Cameron Morton    MR#:  [id number]  DATE OF BIRTH:  Dec 02, 1980  SUBJECTIVE:  CHIEF COMPLAINT:   Chief Complaint  Patient presents with  . Abdominal Pain  Patient continues to complain of abdominal pain, EGD noted, advance to clear liquids  REVIEW OF SYSTEMS:  CONSTITUTIONAL: No fever, fatigue or weakness.  EYES: No blurred or double vision.  EARS, NOSE, AND THROAT: No tinnitus or ear pain.  RESPIRATORY: No cough, shortness of breath, wheezing or hemoptysis.  CARDIOVASCULAR: No chest pain, orthopnea, edema.  GASTROINTESTINAL: No nausea, vomiting, diarrhea or abdominal pain.  GENITOURINARY: No dysuria, hematuria.  ENDOCRINE: No polyuria, nocturia,  HEMATOLOGY: No anemia, easy bruising or bleeding SKIN: No rash or lesion. MUSCULOSKELETAL: No joint pain or arthritis.   NEUROLOGIC: No tingling, numbness, weakness.  PSYCHIATRY: No anxiety or depression.   ROS  DRUG ALLERGIES:  No Known Allergies  VITALS:  Blood pressure (!) 141/75, pulse 78, temperature (!) 97.1 F (36.2 C), resp. rate 15, height 6' (1.829 m), weight 100.7 kg (222 lb 0.1 oz), SpO2 100 %.  PHYSICAL EXAMINATION:  GENERAL:  37 y.o.-year-old patient lying in the bed with no acute distress.  EYES: Pupils equal, round, reactive to light and accommodation. No scleral icterus. Extraocular muscles intact.  HEENT: Head atraumatic, normocephalic. Oropharynx and nasopharynx clear.  NECK:  Supple, no jugular venous distention. No thyroid enlargement, no tenderness.  LUNGS: Normal breath sounds bilaterally, no wheezing, rales,rhonchi or crepitation. No use of accessory muscles of respiration.  CARDIOVASCULAR: S1, S2 normal. No murmurs, rubs, or gallops.  ABDOMEN: Soft, nontender, nondistended. Bowel sounds present. No organomegaly or mass.  EXTREMITIES: No pedal edema, cyanosis, or clubbing.  NEUROLOGIC: Cranial nerves II through XII are intact. Muscle  strength 5/5 in all extremities. Sensation intact. Gait not checked.  PSYCHIATRIC: The patient is alert and oriented x 3.  SKIN: No obvious rash, lesion, or ulcer.   Physical Exam LABORATORY PANEL:   CBC Recent Labs  Lab 08/10/17 0258  WBC 7.0  HGB 10.9*  HCT 31.3*  PLT 203   ------------------------------------------------------------------------------------------------------------------  Chemistries  Recent Labs  Lab 08/09/17 0758 08/10/17 0258  NA 142 141  K 3.2* 3.5  CL 106 108  CO2 28 26  GLUCOSE 123* 108*  BUN 15 12  CREATININE 0.87 0.68  CALCIUM 8.2* 8.1*  MG  --  2.0  AST 20  --   ALT 11  --   ALKPHOS 64  --   BILITOT 0.6  --    ------------------------------------------------------------------------------------------------------------------  Cardiac Enzymes No results for input(s): TROPONINI in the last 168 hours. ------------------------------------------------------------------------------------------------------------------  RADIOLOGY:  US Abdomen Complete  Result Date: 08/10/2017 CLINICAL DATA:  Abdominal pain and known hiatal hernia EXAM: ABDOMEN ULTRASOUND COMPLETE COMPARISON:  08/08/2017 CT of the abdomen and pelvis FINDINGS: Gallbladder: Surgically removed Common bile duct: Diameter: 4 mm Liver: Increased echogenicity is noted consistent with fatty infiltration. No focal mass lesion is noted. Portal vein is patent on color Doppler imaging with normal direction of blood flow towards the liver. IVC: No abnormality visualized. Pancreas: Not visualized due to overlying bowel gas. Spleen: Size and appearance within normal limits. Right Kidney: Length: 10.5 cm. Echogenicity within normal limits. No mass or hydronephrosis visualized. Left Kidney: Length: 12.0 cm. Echogenicity within normal limits. No mass or hydronephrosis visualized. Abdominal aorta: Not well visualized due to overlying bowel gas. Other findings: None. IMPRESSION: Fatty liver. No acute  abnormality noted. Electronically  Signed   By: Inez Catalina M.D.   On: 08/10/2017 09:52    ASSESSMENT AND PLAN:  *Acute on chronic intermittent abdominal pain with emesis  Stable ?  Secondary to pancreatitis and acute esophagitis   abdominal ultrasound was a normal study, lipid panel was within normal limits, gastroenterology input appreciated status post EGD August 10, 2017 noted for esophagitis status post biopsy/3 cm hiatal hernia/congestive gastropathy status post biopsy, PPI daily, clear liquid diet for now and advance as tolerated, antiemetics PRN  CT abdomen noted, lactic acid and sed rate were normal  *Acute Pancreatitis Etiology unknown Lipid panel within normal limits, ultrasound abdomen was normal, CT abdomen noted   *Acute abnormal EGD   Plan of care as stated above   *Acute Melena Resolved Status post EGD as stated above by gastroenterology, plan of care per above   *History of hepatitis C Treated with Harvoni  Disposition pending clinical course  All the records are reviewed and case discussed with Care Management/Social Workerr. Management plans discussed with the patient, family and they are in agreement.  CODE STATUS: full  TOTAL TIME TAKING CARE OF THIS PATIENT: 35 minutes.     POSSIBLE D/C IN 1-2 DAYS, DEPENDING ON CLINICAL CONDITION.   Avel Peace Salary M.D on 08/10/2017   Between 7am to 6pm - Pager - [phone]  After 6pm go to [email or web address] - password [email or web address] [facility]  Office  [phone]  CC: Primary care physician; System, Pcp Not In  Note: This dictation was prepared with Dragon dictation along with smaller phrase technology. Any transcriptional errors that result from this process are unintentional.

## 2017-08-10 NOTE — Anesthesia Preprocedure Evaluation (Signed)
Anesthesia Evaluation  Patient identified by MRN, date of birth, ID band Patient awake    Reviewed: Allergy & Precautions, H&P , NPO status , Patient's Chart, lab work & pertinent test results  History of Anesthesia Complications Negative for: history of anesthetic complications  Airway Mallampati: III  TM Distance: >3 FB Neck ROM: full    Dental  (+) Chipped, Poor Dentition, Missing   Pulmonary neg shortness of breath, Current Smoker,           Cardiovascular Exercise Tolerance: Good (-) angina(-) Past MI and (-) DOE negative cardio ROS       Neuro/Psych negative neurological ROS  negative psych ROS   GI/Hepatic PUD, GERD  Medicated and Controlled,(+) Hepatitis -, C  Endo/Other  negative endocrine ROS  Renal/GU negative Renal ROS  negative genitourinary   Musculoskeletal   Abdominal   Peds  Hematology negative hematology ROS (+)   Anesthesia Other Findings Patient is NPO appropriate and reports no nausea or vomiting today.  Past Medical History: No date: Hepatitis C No date: MRSA (methicillin resistant staph aureus) culture positive 2012: Stomach ulcer  Past Surgical History: No date: BELOW KNEE LEG AMPUTATION 09/12/2014: ESOPHAGOGASTRODUODENOSCOPY; N/A     Comment:  Procedure: ESOPHAGOGASTRODUODENOSCOPY (EGD);  Surgeon:               Teena Irani, MD;  Location: [facility] ENDOSCOPY;  Service:               Endoscopy;  Laterality: N/A; 09/10/2014: FLEXIBLE SIGMOIDOSCOPY; N/A     Comment:  Procedure: FLEXIBLE SIGMOIDOSCOPY;  Surgeon: Teena Irani,              MD;  Location: [place];  Service: Endoscopy;                Laterality: N/A;  BMI    Body Mass Index:  30.11 kg/m      Reproductive/Obstetrics negative OB ROS                             Anesthesia Physical Anesthesia Plan  ASA: III  Anesthesia Plan: General   Post-op Pain Management:    Induction: Intravenous  PONV  Risk Score and Plan: Propofol infusion and TIVA  Airway Management Planned: Natural Airway and Nasal Cannula  Additional Equipment:   Intra-op Plan:   Post-operative Plan:   Informed Consent: I have reviewed the patients History and Physical, chart, labs and discussed the procedure including the risks, benefits and alternatives for the proposed anesthesia with the patient or authorized representative who has indicated his/her understanding and acceptance.   Dental Advisory Given  Plan Discussed with: Anesthesiologist, CRNA and Surgeon  Anesthesia Plan Comments: (Patient consented for risks of anesthesia including but not limited to:  - adverse reactions to medications - risk of intubation if required - damage to teeth, lips or other oral mucosa - sore throat or hoarseness - Damage to heart, brain, lungs or loss of life  Patient voiced understanding.)        Anesthesia Quick Evaluation

## 2017-08-10 NOTE — OR Nursing (Signed)
IV appears infiltrated, swollen.  Looked for another site to start IV on right arm, nothing appeared.  Pt being tranfered back to room by Cameron Morton from transportation.

## 2017-08-10 NOTE — Transfer of Care (Signed)
Immediate Anesthesia Transfer of Care Note  Patient: Cameron Morton  Procedure(s) Performed: ESOPHAGOGASTRODUODENOSCOPY (EGD) (N/A )  Patient Location: PACU  Anesthesia Type:General  Level of Consciousness: awake, alert  and oriented  Airway & Oxygen Therapy: Patient Spontanous Breathing and Patient connected to nasal cannula oxygen  Post-op Assessment: Report given to RN and Post -op Vital signs reviewed and stable  Post vital signs: Reviewed and stable  Last Vitals:  Vitals Value Taken Time  BP    Temp    Pulse    Resp    SpO2      Last Pain:  Vitals:   08/10/17 1206  TempSrc: Oral  PainSc:       Patients Stated Pain Goal: 0 (72/[id number]/66 [id number])  Complications: No apparent anesthesia complications

## 2017-08-10 NOTE — Anesthesia Post-op Follow-up Note (Signed)
Anesthesia QCDR form completed.        

## 2017-08-10 NOTE — Anesthesia Postprocedure Evaluation (Signed)
Anesthesia Post Note  Patient: Eliazar Olivar  Procedure(s) Performed: ESOPHAGOGASTRODUODENOSCOPY (EGD) (N/A )  Patient location during evaluation: Endoscopy Anesthesia Type: General Level of consciousness: awake and alert Pain management: pain level controlled Vital Signs Assessment: post-procedure vital signs reviewed and stable Respiratory status: spontaneous breathing, nonlabored ventilation, respiratory function stable and patient connected to nasal cannula oxygen Cardiovascular status: blood pressure returned to baseline and stable Postop Assessment: no apparent nausea or vomiting Anesthetic complications: no     Last Vitals:  Vitals:   08/10/17 1332 08/10/17 1342  BP: (!) 145/86 (!) 141/75  Pulse: (!) 45 78  Resp: 17 15  Temp:    SpO2: 100% 100%    Last Pain:  Vitals:   08/10/17 1342  TempSrc:   PainSc: 6                  Precious Haws Aphrodite Harpenau

## 2017-08-10 NOTE — Op Note (Signed)
[facility] Gastroenterology Patient Name: Cameron Morton Procedure Date: 08/10/2017 1:02 PM MRN: [id number] Account #: [id number] Date of Birth: Jun 09, 1980 Admit Type: Outpatient Age: 37 Room: [facility] ENDO ROOM 2 Gender: Male Note Status: Finalized Procedure:            Upper GI endoscopy Indications:          Epigastric abdominal pain, Melena Providers:            Benay Pike. Alice Reichert MD, MD Referring MD:         No Local Md, MD (Referring MD) Medicines:            Propofol per Anesthesia Complications:        No immediate complications. Procedure:            Pre-Anesthesia Assessment:                       - The risks and benefits of the procedure and the                        sedation options and risks were discussed with the                        patient. All questions were answered and informed                        consent was obtained.                       - Patient identification and proposed procedure were                        verified prior to the procedure by the nurse. The                        procedure was verified in the procedure room.                       - ASA Grade Assessment: III - A patient with severe                        systemic disease.                       - After reviewing the risks and benefits, the patient                        was deemed in satisfactory condition to undergo the                        procedure.                       After obtaining informed consent, the endoscope was                        passed under direct vision. Throughout the procedure,                        the patient's blood pressure, pulse, and oxygen  saturations were monitored continuously. The Endoscope                        was introduced through the mouth, and advanced to the                        third part of duodenum. The upper GI endoscopy was                        accomplished without difficulty. The patient  tolerated                        the procedure well. Findings:      LA Grade A (one or more mucosal breaks less than 5 mm, not extending       between tops of 2 mucosal folds) esophagitis with no bleeding was found       in the distal esophagus.      The Z-line was irregular and was found in the distal esophagus. Mucosa       was biopsied with a cold forceps for histology. One specimen bottle was       sent to pathology.      Localized mildly congested mucosa was found in the cardia. consistent       with retching injury Biopsies were taken with a cold forceps for       Helicobacter pylori testing.      A 3 cm hiatal hernia was present.      The examined duodenum was normal.      The exam was otherwise without abnormality. Impression:           - LA Grade A reflux esophagitis.                       - Z-line irregular, in the distal esophagus. Biopsied.                       - Congestive gastropathy. Biopsied.                       - 3 cm hiatal hernia.                       - Normal examined duodenum.                       - The examination was otherwise normal. Recommendation:       - Await pathology results.                       - Return patient to hospital ward for observation.                       - Advance diet as tolerated. Procedure Code(s):    --- Professional ---                       [phone], Esophagogastroduodenoscopy, flexible, transoral;                        with biopsy, single or multiple Diagnosis Code(s):    --- Professional ---  K92.1, Melena (includes Hematochezia)                       R10.13, Epigastric pain                       K44.9, Diaphragmatic hernia without obstruction or                        gangrene                       K31.89, Other diseases of stomach and duodenum                       K22.8, Other specified diseases of esophagus                       K21.0, Gastro-esophageal reflux disease with esophagitis CPT copyright  2017 American Medical Association. All rights reserved. The codes documented in this report are preliminary and upon coder review may  be revised to meet current compliance requirements. Efrain Sella MD, MD 08/10/2017 1:20:25 PM This report has been signed electronically. Number of Addenda: 0 Note Initiated On: 08/10/2017 1:02 PM      [facility]

## 2017-08-11 LAB — CBC
HEMATOCRIT: 30.6 % — AB (ref 40.0–52.0)
HEMOGLOBIN: 10.7 g/dL — AB (ref 13.0–18.0)
MCH: 28.1 pg (ref 26.0–34.0)
MCHC: 34.8 g/dL (ref 32.0–36.0)
MCV: 80.7 fL (ref 80.0–100.0)
Platelets: 194 10*3/uL (ref 150–440)
RBC: 3.8 MIL/uL — ABNORMAL LOW (ref 4.40–5.90)
RDW: 16 % — AB (ref 11.5–14.5)
WBC: 6.4 10*3/uL (ref 3.8–10.6)

## 2017-08-11 LAB — LIPASE, BLOOD: Lipase: 40 U/L (ref 11–51)

## 2017-08-11 LAB — SURGICAL PATHOLOGY

## 2017-08-11 MED ORDER — FAMOTIDINE 20 MG PO TABS: 20.0000 mg | ORAL_TABLET | Freq: Two times a day (BID) | ORAL | 0 refills | Status: AC

## 2017-08-11 NOTE — Progress Notes (Signed)
Pt was given D/C instructions and alerted to get an appt at the [facility]. His vitals were WDL and he stated he understood his instructions and he was released to his girlfriend. I removed his IV without incident.

## 2017-08-11 NOTE — Discharge Summary (Signed)
[place] [facility] [place] NAME: Cameron Morton    MR#:  [id number]  DATE OF BIRTH:  [birth year]  DATE OF ADMISSION:  08/08/2017 ADMITTING PHYSICIAN: Hillary Bow, MD  DATE OF DISCHARGE: No discharge date for patient encounter.  PRIMARY CARE PHYSICIAN: System, Pcp Not In    ADMISSION DIAGNOSIS:  Epigastric pain [R10.13]  DISCHARGE DIAGNOSIS:  Active Problems:   Abdominal pain   SECONDARY DIAGNOSIS:   Past Medical History:  Diagnosis Date  . Hepatitis C   . MRSA (methicillin resistant staph aureus) culture positive   . Stomach ulcer 2012    HOSPITAL COURSE:  *Acute on chronic intermittent abdominal pain with emesis  Stable Exact etiology unknown - ?Secondary to pancreatitis vs esophagitis vs hepatic damage from hepatitis C  abdominal ultrasound was a normal study, lipid panel was within normal limits, lipase slightly elevated on admission-resolved at discharge, gastroenterology did see patient while in house- status post EGD August 10, 2017 noted for esophagitis/ biopies done/3 cm hiatal hernia/congestive gastropathy-we will follow-up status post discharge for results of biopsies with gastroenterology in 2 weeks, PPI daily, tolerated clear liquid diet-instructed to advance diet as tolerated over the next several days, provided antiemetics  CT abdomen noted, lactic acid and sed rate were normal  *AcutePancreatitis Resolved  Etiology unknown Lipid panel within normal limits, ultrasound abdomen was normal, CT abdomen largely unimpressive  *Acute abnormal EGD   Plan of care as stated above   *AcuteMelena Resolved Status post EGD as stated above by gastroenterology plan of care per above   *History of hepatitis C Treated with Harvoni  DISCHARGE CONDITIONS:   stable  CONSULTS OBTAINED:  Treatment Team:  Efrain Sella, MD  DRUG ALLERGIES:  No Known Allergies  DISCHARGE MEDICATIONS:   Allergies as of  08/11/2017   No Known Allergies     Medication List    STOP taking these medications   clindamycin 150 MG capsule Commonly known as:  CLEOCIN     TAKE these medications   famotidine 20 MG tablet Commonly known as:  PEPCID Take 1 tablet (20 mg total) by mouth 2 (two) times daily.   HYDROcodone-acetaminophen 7.5-325 MG tablet Commonly known as:  NORCO Take 1 tablet by mouth every 6 (six) hours as needed for moderate pain.        DISCHARGE INSTRUCTIONS:  If you experience worsening of your admission symptoms, develop shortness of breath, life threatening emergency, suicidal or homicidal thoughts you must seek medical attention immediately by calling 911 or calling your MD immediately  if symptoms less severe.  You Must read complete instructions/literature along with all the possible adverse reactions/side effects for all the Medicines you take and that have been prescribed to you. Take any new Medicines after you have completely understood and accept all the possible adverse reactions/side effects.   Please note  You were cared for by a hospitalist during your hospital stay. If you have any questions about your discharge medications or the care you received while you were in the hospital after you are discharged, you can call the unit and asked to speak with the hospitalist on call if the hospitalist that took care of you is not available. Once you are discharged, your primary care physician will handle any further medical issues. Please note that NO REFILLS for any discharge medications will be authorized once you are discharged, as it is imperative that you return to your primary care physician (or establish a  relationship with a primary care physician if you do not have one) for your aftercare needs so that they can reassess your need for medications and monitor your lab values.    Today   CHIEF COMPLAINT:   Chief Complaint  Patient presents with  . Abdominal Pain     HISTORY OF PRESENT ILLNESS:  37 y.o. male with a known history of hepatitis C, peptic ulcer disease presents to the hospital complaining of intractable abdominal pain and vomiting since yesterday.  Patient has been unable to keep any food or liquids down.  He has received multiple doses of nausea medications and pain medications with no significant improvement in symptoms.  CT scan of the abdomen and pelvis showed nothing acute other than a large hiatal hernia. Patient had similar symptoms 3 years back when he had EGD which showed esophagitis.  No peptic ulcer disease.  Symptoms improved on their own. Patient is being admitted for intractable abdominal pain and vomiting. He also mentions he had to episodes of melena over the last [redacted] week along with normal stools intermittently.  No blood in vomiting VITAL SIGNS:  Blood pressure (!) 161/94, pulse (!) 57, temperature 98.4 F (36.9 C), temperature source Oral, resp. rate 20, height 6' (1.829 m), weight 100.7 kg (222 lb 0.1 oz), SpO2 99 %.  I/O:    Intake/Output Summary (Last 24 hours) at 08/11/2017 1148 Last data filed at 08/11/2017 0335 Gross per 24 hour  Intake 987 ml  Output 425 ml  Net 562 ml    PHYSICAL EXAMINATION:  GENERAL:  37 y.o.-year-old patient lying in the bed with no acute distress.  EYES: Pupils equal, round, reactive to light and accommodation. No scleral icterus. Extraocular muscles intact.  HEENT: Head atraumatic, normocephalic. Oropharynx and nasopharynx clear.  NECK:  Supple, no jugular venous distention. No thyroid enlargement, no tenderness.  LUNGS: Normal breath sounds bilaterally, no wheezing, rales,rhonchi or crepitation. No use of accessory muscles of respiration.  CARDIOVASCULAR: S1, S2 normal. No murmurs, rubs, or gallops.  ABDOMEN: Soft, non-tender, non-distended. Bowel sounds present. No organomegaly or mass.  EXTREMITIES: No pedal edema, cyanosis, or clubbing.  NEUROLOGIC: Cranial nerves II through XII are  intact. Muscle strength 5/5 in all extremities. Sensation intact. Gait not checked.  PSYCHIATRIC: The patient is alert and oriented x 3.  SKIN: No obvious rash, lesion, or ulcer.   DATA REVIEW:   CBC Recent Labs  Lab 08/11/17 0811  WBC 6.4  HGB 10.7*  HCT 30.6*  PLT 194    Chemistries  Recent Labs  Lab 08/09/17 0758 08/10/17 0258  NA 142 141  K 3.2* 3.5  CL 106 108  CO2 28 26  GLUCOSE 123* 108*  BUN 15 12  CREATININE 0.87 0.68  CALCIUM 8.2* 8.1*  MG  --  2.0  AST 20  --   ALT 11  --   ALKPHOS 64  --   BILITOT 0.6  --     Cardiac Enzymes No results for input(s): TROPONINI in the last 168 hours.  Microbiology Results  No results found for this or any previous visit.  RADIOLOGY:  US Abdomen Complete  Result Date: 08/10/2017 CLINICAL DATA:  Abdominal pain and known hiatal hernia EXAM: ABDOMEN ULTRASOUND COMPLETE COMPARISON:  08/08/2017 CT of the abdomen and pelvis FINDINGS: Gallbladder: Surgically removed Common bile duct: Diameter: 4 mm Liver: Increased echogenicity is noted consistent with fatty infiltration. No focal mass lesion is noted. Portal vein is patent on color Doppler imaging with normal  direction of blood flow towards the liver. IVC: No abnormality visualized. Pancreas: Not visualized due to overlying bowel gas. Spleen: Size and appearance within normal limits. Right Kidney: Length: 10.5 cm. Echogenicity within normal limits. No mass or hydronephrosis visualized. Left Kidney: Length: 12.0 cm. Echogenicity within normal limits. No mass or hydronephrosis visualized. Abdominal aorta: Not well visualized due to overlying bowel gas. Other findings: None. IMPRESSION: Fatty liver. No acute abnormality noted. Electronically Signed   By: Inez Catalina M.D.   On: 08/10/2017 09:52    EKG:  No orders found for this or any previous visit.    Management plans discussed with the patient, family and they are in agreement.  CODE STATUS:     Code Status Orders  (From  admission, onward)        Start     Ordered   08/08/17 1445  Full code  Continuous     08/08/17 1445    Code Status History    Date Active Date Inactive Code Status Order ID Comments User Context   09/06/2014 0313 09/13/2014 1755 Full Code [id number]  Truett Mainland, DO Inpatient      TOTAL TIME TAKING CARE OF THIS PATIENT: 35 minutes.    Avel Peace Jaylyn Booher M.D on 08/11/2017 at 11:48 AM  Between 7am to 6pm - Pager - [phone]  After 6pm go to [email or web address] - password [email or web address] [facility]  Office  [phone]  CC: Primary care physician; System, Pcp Not In   Note: This dictation was prepared with Dragon dictation along with smaller phrase technology. Any transcriptional errors that result from this process are unintentional.

## 2017-08-14 ENCOUNTER — Encounter: Payer: Self-pay | Admitting: Internal Medicine

## 2023-04-02 ENCOUNTER — Other Ambulatory Visit: Payer: Self-pay

## 2023-04-02 ENCOUNTER — Emergency Department
Admission: EM | Admit: 2023-04-02 | Discharge: 2023-04-02 | Disposition: A | Discharge status: Home / Self Care | Attending: Emergency Medicine | Admitting: Emergency Medicine

## 2023-04-02 DIAGNOSIS — Z89611 Acquired absence of right leg above knee: Secondary | ICD-10-CM | POA: Insufficient documentation

## 2023-04-02 DIAGNOSIS — K130 Diseases of lips: Secondary | ICD-10-CM | POA: Diagnosis present

## 2023-04-02 HISTORY — DX: Encounter for other specified aftercare: Z51.89

## 2023-04-02 MED ORDER — LIDOCAINE HCL (PF) 1 % IJ SOLN
5.0000 mL | Freq: Once | INTRAMUSCULAR | Status: AC
  Administered 2023-04-02: 5 mL via INTRADERMAL
  Filled 2023-04-02: qty 5

## 2023-04-02 MED ORDER — LIDOCAINE-EPINEPHRINE-TETRACAINE (LET) TOPICAL GEL
3.0000 mL | Freq: Once | TOPICAL | Status: AC
  Administered 2023-04-02: 3 mL via TOPICAL
  Filled 2023-04-02: qty 3

## 2023-04-02 MED ORDER — DOXYCYCLINE HYCLATE 100 MG PO TABS: 100.0000 mg | ORAL_TABLET | Freq: Two times a day (BID) | ORAL | 0 refills | Status: AC

## 2023-04-02 NOTE — ED Notes (Signed)
 See triage notes. Patient c/o swollen lower lip. Patient stated it started out as a cold sore.

## 2023-04-02 NOTE — ED Provider Notes (Signed)
 [facility] Provider Note    Event Date/Time   First MD Initiated Contact with Patient 04/02/23 1007     (approximate)   History   Oral Swelling (Lower lip only, cold sore)   HPI  Cameron Morton is a 43 y.o. male with PMH of hepatitis C, MRSA, right AKA presents for evaluation of lower lip swelling that began 2 days ago.  Patient states that it started as a cold sore and has gotten progressively worse.  Denies any new medications or changes in habits.  No difficulty breathing or swallowing.  Does report that it is quite painful.      Physical Exam   Triage Vital Signs: ED Triage Vitals  Encounter Vitals Group     BP 04/02/23 0925 131/84     Systolic BP Percentile --      Diastolic BP Percentile --      Pulse Rate 04/02/23 0925 81     Resp 04/02/23 0925 18     Temp 04/02/23 0925 98.8 F (37.1 C)     Temp Source 04/02/23 0925 Oral     SpO2 04/02/23 0925 96 %     Weight 04/02/23 0926 220 lb (99.8 kg)     Height 04/02/23 0926 6' (1.829 m)     Head Circumference --      Peak Flow --      Pain Score 04/02/23 0929 10     Pain Loc --      Pain Education --      Exclude from Growth Chart --     Most recent vital signs: Vitals:   04/02/23 0925  BP: 131/84  Pulse: 81  Resp: 18  Temp: 98.8 F (37.1 C)  SpO2: 96%   General: Awake, no distress.  CV:  Good peripheral perfusion.  Resp:  Normal effort.  Abd:  No distention.  Other:  Right side of the lower lip is significantly swollen, cold sores seen along the vermilion border, tender to palpation with induration   ED Results / Procedures / Treatments   Labs (all labs ordered are listed, but only abnormal results are displayed) Labs Reviewed - No data to display   PROCEDURES:  Critical Care performed: No  .Incision and Drainage  Date/Time: 04/02/2023 12:36 PM  Performed by: Cameron Ali, PA-C Authorized by: Cameron Ali, PA-C   Consent:    Consent obtained:  Verbal    Consent given by:  Patient   Risks, benefits, and alternatives were discussed: yes     Risks discussed:  Bleeding, incomplete drainage and pain   Alternatives discussed:  No treatment Universal protocol:    Patient identity confirmed:  Verbally with patient Location:    Type:  Abscess   Size:  2 cm   Location:  Mouth   Mouth location: right lower lip. Pre-procedure details:    Procedure prep: alcohol. Sedation:    Sedation type:  None Anesthesia:    Anesthesia method:  Topical application and local infiltration   Topical anesthetic:  LET   Local anesthetic:  Lidocaine 1% w/o epi Procedure type:    Complexity:  Simple Procedure details:    Incision types:  Stab incision   Incision depth:  Dermal   Wound management:  Probed and deloculated and irrigated with saline   Drainage:  Bloody and purulent   Drainage amount:  Scant   Wound treatment:  Wound left open   Packing materials:  None Post-procedure details:  Procedure completion:  Tolerated well, no immediate complications    MEDICATIONS ORDERED IN ED: Medications  lidocaine-EPINEPHrine-tetracaine (LET) topical gel (3 mLs Topical Given 04/02/23 1149)  lidocaine (PF) (XYLOCAINE) 1 % injection 5 mL (5 mLs Intradermal Given 04/02/23 1201)     IMPRESSION / MDM / ASSESSMENT AND PLAN / ED COURSE  I reviewed the triage vital signs and the nursing notes.                             43 year old male presents for evaluation of right lower lip swelling.  Vital signs are stable patient NAD on exam.  Differential diagnosis includes, but is not limited to, allergic reaction, angioedema, cold sore, cellulitis, abscess.  Patient's presentation is most consistent with acute, uncomplicated illness.  Appears that there may be a fluid collection within the lip.  Will attempt to drain this with I&D.  Patient was agreeable.   I was able to get some puss out of the lip. Please see procedure note for further details.  Patient was advised  to apply warm compresses to the area.  We discussed return precautions.  He will be started on oral antibiotics.  He voiced understanding, all questions were answered and he is stable at discharge.      FINAL CLINICAL IMPRESSION(S) / ED DIAGNOSES   Final diagnoses:  Lip abscess     Rx / DC Orders   ED Discharge Orders          Ordered    doxycycline (VIBRA-TABS) 100 MG tablet  2 times daily        04/02/23 1241             Note:  This document was prepared using Dragon voice recognition software and may include unintentional dictation errors.   Cameron Ali, PA-C 04/02/23 1242    Jene Every, MD 04/02/23 1328

## 2023-04-02 NOTE — Discharge Instructions (Signed)
 Please take the antibiotics as prescribed.  Apply warm compresses to your lip as this will encourage it to continue to drain.  Return to the emergency department with any worsening symptoms.

## 2023-04-02 NOTE — ED Triage Notes (Signed)
 Pt to ED for lower lip swelling since 2 days ago, has cold sore to lower lip that started 2 days before that.
